# Patient Record
Sex: Female | Born: 1970 | Hispanic: Yes | Marital: Single | Smoking: Never smoker
Health system: Southern US, Community
[De-identification: ages and names within clinical notes are randomized; demographics above are authoritative.]

## PROBLEM LIST (undated history)

## (undated) DIAGNOSIS — R87612 Low grade squamous intraepithelial lesion on cytologic smear of cervix (LGSIL): Secondary | ICD-10-CM

## (undated) HISTORY — DX: Low grade squamous intraepithelial lesion on cytologic smear of cervix (LGSIL): R87.612

---

## 1997-05-20 ENCOUNTER — Inpatient Hospital Stay (HOSPITAL_COMMUNITY): Admission: AD | Admit: 1997-05-20 | Discharge: 1997-05-20 | Payer: Self-pay | Admitting: *Deleted

## 1997-09-16 ENCOUNTER — Ambulatory Visit (HOSPITAL_COMMUNITY): Admission: RE | Admit: 1997-09-16 | Discharge: 1997-09-16 | Payer: Self-pay | Admitting: Obstetrics

## 1997-09-19 ENCOUNTER — Other Ambulatory Visit: Admission: RE | Admit: 1997-09-19 | Discharge: 1997-09-19 | Payer: Self-pay | Admitting: Obstetrics

## 1997-09-29 ENCOUNTER — Encounter (HOSPITAL_COMMUNITY): Admission: RE | Admit: 1997-09-29 | Discharge: 1997-12-09 | Payer: Self-pay | Admitting: Obstetrics

## 1997-10-02 ENCOUNTER — Inpatient Hospital Stay (HOSPITAL_COMMUNITY): Admission: AD | Admit: 1997-10-02 | Discharge: 1997-10-02 | Payer: Self-pay | Admitting: Obstetrics & Gynecology

## 1997-11-21 ENCOUNTER — Inpatient Hospital Stay (HOSPITAL_COMMUNITY): Admission: AD | Admit: 1997-11-21 | Discharge: 1997-11-21 | Payer: Self-pay | Admitting: *Deleted

## 1997-12-02 ENCOUNTER — Inpatient Hospital Stay (HOSPITAL_COMMUNITY): Admission: AD | Admit: 1997-12-02 | Discharge: 1997-12-02 | Payer: Self-pay | Admitting: *Deleted

## 1997-12-04 ENCOUNTER — Inpatient Hospital Stay (HOSPITAL_COMMUNITY): Admission: AD | Admit: 1997-12-04 | Discharge: 1997-12-06 | Payer: Self-pay | Admitting: Obstetrics & Gynecology

## 1998-04-03 ENCOUNTER — Other Ambulatory Visit: Admission: RE | Admit: 1998-04-03 | Discharge: 1998-04-03 | Payer: Self-pay | Admitting: Obstetrics

## 1998-06-26 ENCOUNTER — Encounter: Admission: RE | Admit: 1998-06-26 | Discharge: 1998-06-26 | Payer: Self-pay | Admitting: Obstetrics

## 1998-06-26 ENCOUNTER — Other Ambulatory Visit: Admission: RE | Admit: 1998-06-26 | Discharge: 1998-06-26 | Payer: Self-pay | Admitting: Obstetrics

## 1998-07-10 ENCOUNTER — Encounter: Admission: RE | Admit: 1998-07-10 | Discharge: 1998-07-10 | Payer: Self-pay | Admitting: Obstetrics & Gynecology

## 1998-08-23 ENCOUNTER — Emergency Department (HOSPITAL_COMMUNITY): Admission: EM | Admit: 1998-08-23 | Discharge: 1998-08-23 | Payer: Self-pay

## 1999-01-07 ENCOUNTER — Encounter: Admission: RE | Admit: 1999-01-07 | Discharge: 1999-01-07 | Payer: Self-pay | Admitting: Obstetrics

## 1999-04-20 ENCOUNTER — Other Ambulatory Visit: Admission: RE | Admit: 1999-04-20 | Discharge: 1999-04-20 | Payer: Self-pay | Admitting: Obstetrics & Gynecology

## 1999-04-20 ENCOUNTER — Encounter: Admission: RE | Admit: 1999-04-20 | Discharge: 1999-04-20 | Payer: Self-pay | Admitting: Obstetrics & Gynecology

## 1999-05-28 ENCOUNTER — Encounter: Admission: RE | Admit: 1999-05-28 | Discharge: 1999-05-28 | Payer: Self-pay | Admitting: Obstetrics & Gynecology

## 1999-07-02 ENCOUNTER — Encounter: Admission: RE | Admit: 1999-07-02 | Discharge: 1999-07-02 | Payer: Self-pay | Admitting: Obstetrics & Gynecology

## 1999-07-02 ENCOUNTER — Other Ambulatory Visit: Admission: RE | Admit: 1999-07-02 | Discharge: 1999-07-02 | Payer: Self-pay | Admitting: Obstetrics & Gynecology

## 1999-07-20 ENCOUNTER — Encounter: Admission: RE | Admit: 1999-07-20 | Discharge: 1999-07-20 | Payer: Self-pay | Admitting: Obstetrics & Gynecology

## 1999-11-04 ENCOUNTER — Encounter: Admission: RE | Admit: 1999-11-04 | Discharge: 1999-11-04 | Payer: Self-pay | Admitting: Obstetrics

## 2000-01-11 ENCOUNTER — Encounter: Admission: RE | Admit: 2000-01-11 | Discharge: 2000-01-11 | Payer: Self-pay | Admitting: Obstetrics & Gynecology

## 2000-05-12 ENCOUNTER — Emergency Department (HOSPITAL_COMMUNITY): Admission: EM | Admit: 2000-05-12 | Discharge: 2000-05-13 | Payer: Self-pay | Admitting: Emergency Medicine

## 2000-05-12 ENCOUNTER — Encounter: Admission: RE | Admit: 2000-05-12 | Discharge: 2000-05-12 | Payer: Self-pay | Admitting: Obstetrics & Gynecology

## 2000-06-02 ENCOUNTER — Encounter: Admission: RE | Admit: 2000-06-02 | Discharge: 2000-06-02 | Payer: Self-pay

## 2000-06-02 ENCOUNTER — Emergency Department (HOSPITAL_COMMUNITY): Admission: EM | Admit: 2000-06-02 | Discharge: 2000-06-02 | Payer: Self-pay | Admitting: Emergency Medicine

## 2000-06-02 ENCOUNTER — Encounter: Payer: Self-pay | Admitting: Emergency Medicine

## 2000-06-05 ENCOUNTER — Emergency Department (HOSPITAL_COMMUNITY): Admission: EM | Admit: 2000-06-05 | Discharge: 2000-06-06 | Payer: Self-pay | Admitting: Emergency Medicine

## 2000-06-06 ENCOUNTER — Emergency Department (HOSPITAL_COMMUNITY): Admission: EM | Admit: 2000-06-06 | Discharge: 2000-06-06 | Payer: Self-pay | Admitting: *Deleted

## 2000-06-06 ENCOUNTER — Encounter: Payer: Self-pay | Admitting: Emergency Medicine

## 2000-06-07 ENCOUNTER — Encounter: Admission: RE | Admit: 2000-06-07 | Discharge: 2000-06-07 | Payer: Self-pay | Admitting: Internal Medicine

## 2000-06-14 ENCOUNTER — Encounter: Admission: RE | Admit: 2000-06-14 | Discharge: 2000-06-14 | Payer: Self-pay | Admitting: Hematology and Oncology

## 2000-06-19 ENCOUNTER — Emergency Department (HOSPITAL_COMMUNITY): Admission: EM | Admit: 2000-06-19 | Discharge: 2000-06-19 | Payer: Self-pay | Admitting: Emergency Medicine

## 2000-06-22 ENCOUNTER — Emergency Department (HOSPITAL_COMMUNITY): Admission: EM | Admit: 2000-06-22 | Discharge: 2000-06-22 | Payer: Self-pay | Admitting: Emergency Medicine

## 2000-06-23 ENCOUNTER — Emergency Department (HOSPITAL_COMMUNITY): Admission: EM | Admit: 2000-06-23 | Discharge: 2000-06-23 | Payer: Self-pay | Admitting: Emergency Medicine

## 2000-06-26 ENCOUNTER — Emergency Department (HOSPITAL_COMMUNITY): Admission: EM | Admit: 2000-06-26 | Discharge: 2000-06-26 | Payer: Self-pay | Admitting: *Deleted

## 2000-06-28 ENCOUNTER — Emergency Department (HOSPITAL_COMMUNITY): Admission: EM | Admit: 2000-06-28 | Discharge: 2000-06-28 | Payer: Self-pay | Admitting: Emergency Medicine

## 2000-07-11 ENCOUNTER — Encounter: Admission: RE | Admit: 2000-07-11 | Discharge: 2000-07-11 | Payer: Self-pay | Admitting: Hematology and Oncology

## 2000-07-12 ENCOUNTER — Emergency Department (HOSPITAL_COMMUNITY): Admission: EM | Admit: 2000-07-12 | Discharge: 2000-07-13 | Payer: Self-pay | Admitting: Internal Medicine

## 2000-07-14 ENCOUNTER — Emergency Department (HOSPITAL_COMMUNITY): Admission: EM | Admit: 2000-07-14 | Discharge: 2000-07-15 | Payer: Self-pay | Admitting: Emergency Medicine

## 2000-07-15 ENCOUNTER — Encounter: Payer: Self-pay | Admitting: Emergency Medicine

## 2000-07-25 ENCOUNTER — Encounter: Admission: RE | Admit: 2000-07-25 | Discharge: 2000-07-25 | Payer: Self-pay | Admitting: Hematology and Oncology

## 2000-08-03 ENCOUNTER — Encounter: Admission: RE | Admit: 2000-08-03 | Discharge: 2000-08-03 | Payer: Self-pay | Admitting: Hematology and Oncology

## 2000-08-17 ENCOUNTER — Encounter: Admission: RE | Admit: 2000-08-17 | Discharge: 2000-08-17 | Payer: Self-pay | Admitting: Internal Medicine

## 2000-09-19 ENCOUNTER — Encounter: Admission: RE | Admit: 2000-09-19 | Discharge: 2000-09-19 | Payer: Self-pay | Admitting: Obstetrics & Gynecology

## 2000-09-19 ENCOUNTER — Other Ambulatory Visit: Admission: RE | Admit: 2000-09-19 | Discharge: 2000-09-19 | Payer: Self-pay | Admitting: Obstetrics & Gynecology

## 2001-02-06 ENCOUNTER — Encounter: Admission: RE | Admit: 2001-02-06 | Discharge: 2001-02-06 | Payer: Self-pay | Admitting: Obstetrics & Gynecology

## 2001-04-16 ENCOUNTER — Ambulatory Visit (HOSPITAL_COMMUNITY): Admission: RE | Admit: 2001-04-16 | Discharge: 2001-04-16 | Payer: Self-pay | Admitting: *Deleted

## 2001-05-04 ENCOUNTER — Encounter: Payer: Self-pay | Admitting: *Deleted

## 2001-05-04 ENCOUNTER — Ambulatory Visit (HOSPITAL_COMMUNITY): Admission: RE | Admit: 2001-05-04 | Discharge: 2001-05-04 | Payer: Self-pay | Admitting: Obstetrics & Gynecology

## 2001-06-13 ENCOUNTER — Emergency Department (HOSPITAL_COMMUNITY): Admission: EM | Admit: 2001-06-13 | Discharge: 2001-06-13 | Payer: Self-pay | Admitting: Emergency Medicine

## 2001-10-12 ENCOUNTER — Inpatient Hospital Stay (HOSPITAL_COMMUNITY): Admission: AD | Admit: 2001-10-12 | Discharge: 2001-10-14 | Payer: Self-pay | Admitting: *Deleted

## 2001-10-12 ENCOUNTER — Encounter (INDEPENDENT_AMBULATORY_CARE_PROVIDER_SITE_OTHER): Payer: Self-pay

## 2001-10-16 HISTORY — PX: TUBAL LIGATION: SHX77

## 2002-02-13 ENCOUNTER — Encounter: Admission: RE | Admit: 2002-02-13 | Discharge: 2002-02-13 | Payer: Self-pay | Admitting: *Deleted

## 2003-04-25 ENCOUNTER — Encounter: Admission: RE | Admit: 2003-04-25 | Discharge: 2003-04-25 | Payer: Self-pay | Admitting: Family Medicine

## 2003-06-02 ENCOUNTER — Encounter: Admission: RE | Admit: 2003-06-02 | Discharge: 2003-06-02 | Payer: Self-pay | Admitting: Family Medicine

## 2003-11-13 ENCOUNTER — Encounter: Admission: RE | Admit: 2003-11-13 | Discharge: 2003-11-13 | Payer: Self-pay | Admitting: Family Medicine

## 2004-07-16 ENCOUNTER — Ambulatory Visit: Payer: Self-pay | Admitting: Family Medicine

## 2004-07-27 ENCOUNTER — Ambulatory Visit: Payer: Self-pay | Admitting: Family Medicine

## 2005-07-14 ENCOUNTER — Emergency Department (HOSPITAL_COMMUNITY): Admission: EM | Admit: 2005-07-14 | Discharge: 2005-07-15 | Payer: Self-pay | Admitting: Emergency Medicine

## 2005-07-17 ENCOUNTER — Encounter (INDEPENDENT_AMBULATORY_CARE_PROVIDER_SITE_OTHER): Payer: Self-pay | Admitting: *Deleted

## 2005-07-17 LAB — CONVERTED CEMR LAB

## 2005-08-08 ENCOUNTER — Encounter (INDEPENDENT_AMBULATORY_CARE_PROVIDER_SITE_OTHER): Payer: Self-pay | Admitting: Specialist

## 2005-08-08 ENCOUNTER — Ambulatory Visit: Payer: Self-pay | Admitting: Family Medicine

## 2005-09-20 ENCOUNTER — Ambulatory Visit: Payer: Self-pay | Admitting: Family Medicine

## 2005-09-28 ENCOUNTER — Ambulatory Visit: Payer: Self-pay | Admitting: Family Medicine

## 2005-10-28 ENCOUNTER — Emergency Department (HOSPITAL_COMMUNITY): Admission: EM | Admit: 2005-10-28 | Discharge: 2005-10-28 | Payer: Self-pay | Admitting: Family Medicine

## 2006-01-03 ENCOUNTER — Emergency Department (HOSPITAL_COMMUNITY): Admission: EM | Admit: 2006-01-03 | Discharge: 2006-01-03 | Payer: Self-pay | Admitting: Family Medicine

## 2006-01-06 ENCOUNTER — Ambulatory Visit: Payer: Self-pay | Admitting: Gynecology

## 2006-01-29 ENCOUNTER — Emergency Department (HOSPITAL_COMMUNITY): Admission: EM | Admit: 2006-01-29 | Discharge: 2006-01-29 | Payer: Self-pay | Admitting: Emergency Medicine

## 2006-06-16 ENCOUNTER — Encounter (INDEPENDENT_AMBULATORY_CARE_PROVIDER_SITE_OTHER): Payer: Self-pay | Admitting: *Deleted

## 2006-07-17 ENCOUNTER — Emergency Department (HOSPITAL_COMMUNITY): Admission: EM | Admit: 2006-07-17 | Discharge: 2006-07-17 | Payer: Self-pay | Admitting: Emergency Medicine

## 2006-08-09 ENCOUNTER — Emergency Department (HOSPITAL_COMMUNITY): Admission: EM | Admit: 2006-08-09 | Discharge: 2006-08-09 | Payer: Self-pay | Admitting: Family Medicine

## 2006-11-08 ENCOUNTER — Encounter: Payer: Self-pay | Admitting: *Deleted

## 2007-01-19 ENCOUNTER — Ambulatory Visit: Payer: Self-pay | Admitting: Family Medicine

## 2007-02-20 ENCOUNTER — Encounter (INDEPENDENT_AMBULATORY_CARE_PROVIDER_SITE_OTHER): Payer: Self-pay | Admitting: Family Medicine

## 2007-02-20 ENCOUNTER — Ambulatory Visit: Payer: Self-pay | Admitting: Family Medicine

## 2007-02-20 DIAGNOSIS — E669 Obesity, unspecified: Secondary | ICD-10-CM

## 2007-02-20 LAB — CONVERTED CEMR LAB: Beta hcg, urine, semiquantitative: NEGATIVE

## 2007-02-21 LAB — CONVERTED CEMR LAB
ALT: 17 units/L (ref 0–35)
BUN: 10 mg/dL (ref 6–23)
Chloride: 102 meq/L (ref 96–112)
Glucose, Bld: 87 mg/dL (ref 70–99)
Sodium: 141 meq/L (ref 135–145)
Total CHOL/HDL Ratio: 4.3
Total Protein: 8 g/dL (ref 6.0–8.3)

## 2007-02-26 ENCOUNTER — Telehealth (INDEPENDENT_AMBULATORY_CARE_PROVIDER_SITE_OTHER): Payer: Self-pay | Admitting: Family Medicine

## 2007-02-27 ENCOUNTER — Telehealth: Payer: Self-pay | Admitting: *Deleted

## 2007-03-06 ENCOUNTER — Ambulatory Visit: Payer: Self-pay | Admitting: Family Medicine

## 2007-03-06 DIAGNOSIS — R87613 High grade squamous intraepithelial lesion on cytologic smear of cervix (HGSIL): Secondary | ICD-10-CM

## 2007-03-19 ENCOUNTER — Encounter: Payer: Self-pay | Admitting: Family Medicine

## 2007-03-19 ENCOUNTER — Telehealth (INDEPENDENT_AMBULATORY_CARE_PROVIDER_SITE_OTHER): Payer: Self-pay | Admitting: *Deleted

## 2007-03-20 ENCOUNTER — Encounter (INDEPENDENT_AMBULATORY_CARE_PROVIDER_SITE_OTHER): Payer: Self-pay | Admitting: *Deleted

## 2007-03-21 ENCOUNTER — Encounter (INDEPENDENT_AMBULATORY_CARE_PROVIDER_SITE_OTHER): Payer: Self-pay | Admitting: *Deleted

## 2007-04-07 ENCOUNTER — Emergency Department (HOSPITAL_COMMUNITY): Admission: EM | Admit: 2007-04-07 | Discharge: 2007-04-08 | Payer: Self-pay | Admitting: Emergency Medicine

## 2007-06-28 ENCOUNTER — Encounter: Payer: Self-pay | Admitting: *Deleted

## 2007-06-28 ENCOUNTER — Ambulatory Visit: Payer: Self-pay | Admitting: Sports Medicine

## 2007-06-28 ENCOUNTER — Encounter (INDEPENDENT_AMBULATORY_CARE_PROVIDER_SITE_OTHER): Payer: Self-pay | Admitting: Family Medicine

## 2007-06-28 LAB — CONVERTED CEMR LAB
Bilirubin Urine: NEGATIVE
Glucose, Urine, Semiquant: NEGATIVE
Nitrite: POSITIVE
Specific Gravity, Urine: 1.015
Urobilinogen, UA: 1

## 2007-07-13 ENCOUNTER — Ambulatory Visit: Payer: Self-pay | Admitting: Family Medicine

## 2007-07-13 ENCOUNTER — Encounter: Payer: Self-pay | Admitting: Family Medicine

## 2007-07-13 ENCOUNTER — Encounter: Payer: Self-pay | Admitting: *Deleted

## 2007-12-06 ENCOUNTER — Ambulatory Visit: Payer: Self-pay | Admitting: Family Medicine

## 2008-02-07 ENCOUNTER — Encounter: Payer: Self-pay | Admitting: Family Medicine

## 2008-02-07 ENCOUNTER — Ambulatory Visit: Payer: Self-pay | Admitting: Family Medicine

## 2008-02-17 DIAGNOSIS — R87612 Low grade squamous intraepithelial lesion on cytologic smear of cervix (LGSIL): Secondary | ICD-10-CM

## 2008-02-17 HISTORY — DX: Low grade squamous intraepithelial lesion on cytologic smear of cervix (LGSIL): R87.612

## 2008-02-25 ENCOUNTER — Encounter: Payer: Self-pay | Admitting: Family Medicine

## 2008-03-18 ENCOUNTER — Encounter (INDEPENDENT_AMBULATORY_CARE_PROVIDER_SITE_OTHER): Payer: Self-pay | Admitting: *Deleted

## 2008-05-19 ENCOUNTER — Ambulatory Visit: Payer: Self-pay | Admitting: Family Medicine

## 2008-05-19 LAB — CONVERTED CEMR LAB: Rapid Strep: NEGATIVE

## 2008-09-16 ENCOUNTER — Ambulatory Visit: Payer: Self-pay | Admitting: Family Medicine

## 2008-09-16 LAB — CONVERTED CEMR LAB: Beta hcg, urine, semiquantitative: NEGATIVE

## 2008-10-24 ENCOUNTER — Emergency Department (HOSPITAL_COMMUNITY): Admission: EM | Admit: 2008-10-24 | Discharge: 2008-10-24 | Payer: Self-pay | Admitting: Family Medicine

## 2008-12-15 ENCOUNTER — Ambulatory Visit: Payer: Self-pay | Admitting: Family Medicine

## 2008-12-15 ENCOUNTER — Encounter: Payer: Self-pay | Admitting: Family Medicine

## 2009-01-27 ENCOUNTER — Encounter: Payer: Self-pay | Admitting: Family Medicine

## 2009-01-28 ENCOUNTER — Ambulatory Visit: Payer: Self-pay | Admitting: Family Medicine

## 2009-01-28 ENCOUNTER — Encounter: Payer: Self-pay | Admitting: Family Medicine

## 2009-05-27 ENCOUNTER — Emergency Department (HOSPITAL_COMMUNITY): Admission: EM | Admit: 2009-05-27 | Discharge: 2009-05-27 | Payer: Self-pay | Admitting: Family Medicine

## 2009-05-27 ENCOUNTER — Encounter: Payer: Self-pay | Admitting: Family Medicine

## 2009-07-30 ENCOUNTER — Ambulatory Visit: Payer: Self-pay | Admitting: Internal Medicine

## 2009-08-11 ENCOUNTER — Ambulatory Visit: Payer: Self-pay | Admitting: Family Medicine

## 2009-08-12 ENCOUNTER — Ambulatory Visit: Payer: Self-pay | Admitting: Family Medicine

## 2009-08-12 ENCOUNTER — Encounter: Payer: Self-pay | Admitting: Family Medicine

## 2009-08-12 LAB — CONVERTED CEMR LAB
ALT: 13 units/L (ref 0–35)
AST: 11 units/L (ref 0–37)
BUN: 12 mg/dL (ref 6–23)
CO2: 27 meq/L (ref 19–32)
Chloride: 104 meq/L (ref 96–112)
HDL: 35 mg/dL — ABNORMAL LOW (ref >39)
Potassium: 4.3 meq/L (ref 3.5–5.3)
Sodium: 139 meq/L (ref 135–145)
Total CHOL/HDL Ratio: 3.8
Triglycerides: 114 mg/dL (ref <150)
VLDL: 23 mg/dL (ref 0–40)

## 2009-08-13 ENCOUNTER — Encounter: Payer: Self-pay | Admitting: Family Medicine

## 2009-08-17 ENCOUNTER — Encounter: Payer: Self-pay | Admitting: Family Medicine

## 2009-09-10 ENCOUNTER — Ambulatory Visit: Payer: Self-pay | Admitting: Family Medicine

## 2009-09-17 ENCOUNTER — Encounter: Payer: Self-pay | Admitting: Family Medicine

## 2009-09-25 ENCOUNTER — Emergency Department (HOSPITAL_COMMUNITY): Admission: EM | Admit: 2009-09-25 | Discharge: 2009-09-25 | Payer: Self-pay | Admitting: Emergency Medicine

## 2009-10-01 ENCOUNTER — Other Ambulatory Visit: Admission: RE | Admit: 2009-10-01 | Discharge: 2009-10-01 | Payer: Self-pay | Admitting: Gynecology

## 2009-10-01 ENCOUNTER — Ambulatory Visit: Payer: Self-pay | Admitting: Obstetrics & Gynecology

## 2009-10-22 ENCOUNTER — Ambulatory Visit: Payer: Self-pay | Admitting: Obstetrics and Gynecology

## 2009-10-29 ENCOUNTER — Ambulatory Visit: Payer: Self-pay | Admitting: Obstetrics and Gynecology

## 2009-10-29 ENCOUNTER — Other Ambulatory Visit: Admission: RE | Admit: 2009-10-29 | Discharge: 2009-10-29 | Payer: Self-pay | Admitting: Obstetrics & Gynecology

## 2009-11-12 ENCOUNTER — Ambulatory Visit: Payer: Self-pay | Admitting: Obstetrics & Gynecology

## 2010-04-12 ENCOUNTER — Emergency Department (HOSPITAL_COMMUNITY)
Admission: EM | Admit: 2010-04-12 | Discharge: 2010-04-12 | Payer: Self-pay | Source: Home / Self Care | Admitting: Emergency Medicine

## 2010-04-18 HISTORY — PX: LEEP: SHX91

## 2010-05-17 ENCOUNTER — Ambulatory Visit
Admission: RE | Admit: 2010-05-17 | Discharge: 2010-05-17 | Payer: Self-pay | Source: Home / Self Care | Attending: Obstetrics and Gynecology | Admitting: Obstetrics and Gynecology

## 2010-05-17 ENCOUNTER — Other Ambulatory Visit (HOSPITAL_COMMUNITY)
Admission: RE | Admit: 2010-05-17 | Discharge: 2010-05-17 | Disposition: A | Discharge status: Home / Self Care | Payer: Self-pay | Source: Ambulatory Visit | Attending: Obstetrics & Gynecology | Admitting: Obstetrics & Gynecology

## 2010-05-17 ENCOUNTER — Other Ambulatory Visit: Payer: Self-pay | Admitting: Obstetrics & Gynecology

## 2010-05-17 DIAGNOSIS — Z01419 Encounter for gynecological examination (general) (routine) without abnormal findings: Secondary | ICD-10-CM | POA: Insufficient documentation

## 2010-05-17 DIAGNOSIS — Z1159 Encounter for screening for other viral diseases: Secondary | ICD-10-CM | POA: Insufficient documentation

## 2010-05-18 NOTE — Letter (Signed)
Summary: Generic Letter  [facility]  [address]   [place], [place] [address]   Phone: [phone]  Fax: [phone]    08/17/2009  [facility] [address] [place], [place]  [address]  Dear Catherine Austin,  Su papaniculau vuelve abnormal.  Necesita hacer una cita para colposcopia, como discutimos, para obtener una biopsia.  Por favor, llame a la clinica para hacer una cita en [facility] para una colposcopia.  Si tiene preguntas, llamame.          Sincerely,   Ardeen Garland  MD  Appended Document: Generic Letter mailed.  Appended Document: Generic Letter will be here for pap next thursday

## 2010-05-18 NOTE — Assessment & Plan Note (Signed)
Summary: colpo/[place]/mayans   Vital Signs:  Patient profile:   40 year old female Height:      62 inches Weight:      219.1 pounds BMI:     40.22 Temp:     98.0 degrees F oral Pulse rate:   77 / minute BP sitting:   109 / 74  (left arm) Cuff size:   large  Vitals Entered By: Gladstone Pih (Sep 10, 2009 11:23 AM) CC: Colpo Is Patient Diabetic? No Pain Assessment Patient in pain? no        Primary Care Provider:  Ardeen Garland  MD  CC:  Colpo.  History of Present Illness: Here for colposcopy asymptomatic but multiple prior abnl paps, abnormal colpo. Was referred at last colposcopy visit but she never went to GYN  Habits & Providers  Alcohol-Tobacco-Diet     Tobacco Status: never  Allergies: No Known Drug Allergies  Physical Exam  General:  alert and well-developed.     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 She did not go for her referral after last colposcopy. Using interpretor Darrelyn Hillock) today I think I impressed upon her the absolute need to comply with refewrral at this point. She agreed.  Complete Medication List: 1)  Claritin 10 Mg Tabs (Loratadine) .Marland Kitchen.. 1 by mouth once daily for itching rash/allergies.  instructions in spanish please 2)  Ibuprofen 600 Mg Tabs (Ibuprofen) .Marland Kitchen.. 1 tab by mouth three times a day for 1 week then q 6 hours as needed for pain. instructions in spanish please

## 2010-05-18 NOTE — Letter (Signed)
Summary: Generic Letter  [facility]  [address]   [place], [place] [address]   Phone: [phone]  Fax: [phone]    08/13/2009  [facility] [address] [place], [place]  [address]  Dear Ms. Samad,  Es un placer decirle que todos de sus examenes de sangre son normales.  Los rinones son buenos.  No tiene diabetes o colesterol alto.  Si tiene preguntas, por favor llama a Event organiser.          Sincerely,   Ardeen Garland  MD  Appended Document: Generic Letter mailed.

## 2010-05-18 NOTE — Miscellaneous (Signed)
Summary: late for wi appt  Clinical Lists Changes pt came more than 30 minutes late for appt - it was 11:25am. (thinks she has the flu) the md has left for lunch. we have no other appts left for the day. suggested UC as they could see her now w/o appt. she agreed with plan.[place] Circle RN  May 27, 2009 12:04 PM

## 2010-05-18 NOTE — Assessment & Plan Note (Signed)
Summary: CPP/[place]   Vital Signs:  Patient profile:   40 year old female Height:      62 inches Weight:      216 pounds BMI:     39.65 Temp:     98.3 degrees F oral Pulse rate:   87 / minute BP sitting:   108 / 71  (left arm) Cuff size:   large  Vitals Entered By: Tessie Fass CMA (August 11, 2009 11:16 AM) CC: complete physical with pap Is Patient Diabetic? No Pain Assessment Patient in pain? no        Primary Care Provider:  Ardeen Garland  MD  CC:  complete physical with pap.  History of Present Illness: Catherine Austin comes in for a physical with pap.  Her last pap was in 10/09 and was LSIL.  She did not keep her colpo appt.  This is the first pap since then. SHe is feeling well and has no complaints.  SHe has already eaten today.   Habits & Providers  Alcohol-Tobacco-Diet     Tobacco Status: never  Current Medications (verified): 1)  Claritin 10 Mg Tabs (Loratadine) .Marland Kitchen.. 1 By Mouth Once Daily For Itching Rash/allergies.  Instructions in Spanish Please 2)  Ibuprofen 600 Mg Tabs (Ibuprofen) .Marland Kitchen.. 1 Tab By Mouth Three Times A Day For 1 Week Then Q 6 Hours As Needed For Pain. 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  Colpo showed LGSiL.  [facility] but didn't keep appt.  Past Surgical History: Last updated: 06/28/2007 btl - 09/16/2001, olonoscopy  low grade IEL - 09/16/2005    Family History: Last updated: 02/07/2008 anemia, Colon CA 1st degree .  NO family history of breast cancer.   Social History: Last updated: 12/15/2008 from [place] here since 1994; lives with husband, 3 kids; denies etoh, tobacco, drugs .  broken english  Physical Exam  General:  obese, alert, NAD, cooperative to exam vitals reviewed Eyes:  pupils equal, pupils round, pupils reactive to light, corneas and lenses clear, and no  injection.   Ears:  R ear normal and L ear normal.   Nose:  no nasal discharge, no mucosal pallor, and no mucosal edema.   Mouth:  Oral mucosa and oropharynx without lesions or exudates.  Teeth in good repair. Neck:  No deformities, masses, or tenderness noted. Breasts:  No mass, nodules, thickening, tenderness, bulging, retraction, inflamation, nipple discharge or skin changes noted.   Lungs:  Normal respiratory effort, chest expands symmetrically. Lungs are clear to auscultation, no crackles or wheezes. Heart:  Normal rate and regular rhythm. S1 and S2 normal without gallop, murmur, click, rub or other extra sounds. Abdomen:  Bowel sounds positive,abdomen soft and non-tender without masses, organomegaly or hernias noted. Genitalia:  normal introitus, no external lesions, no vaginal discharge, mucosa pink and moist, no vaginal atrophy, no friaility or hemorrhage, normal uterus size and position, and no adnexal masses or tenderness.  cervix erythematous and friable Msk:  normal ROM.   Pulses:  2+ radial and dp pulses Extremities:  no edema Neurologic:  alert & oriented X3 and gait normal.   Skin:  Intact without suspicious lesions or rashes Cervical Nodes:  No lymphadenopathy noted Axillary Nodes:  No palpable lymphadenopathy Psych:  Oriented X3, memory intact for recent and remote, normally interactive, good eye contact, not anxious appearing,  and not depressed appearing.     Impression & Recommendations:  Problem # 1:  ROUTINE GYNECOLOGICAL EXAMINATION (ICD-V72.31) Assessment New  Pap obtained.  DIscussed what colpo was and that if pap abnormal again, she would have to get it done.  Discussed imprtance of the colpo for identifying early cancer changes.  Patient expressed understanding.   Orders: [facility] - Est  18-39 yrs ([id number])  Complete Medication List: 1)  Claritin 10 Mg Tabs (Loratadine) .Marland Kitchen.. 1 by mouth once daily for itching rash/allergies.  instructions in spanish please 2)   Ibuprofen 600 Mg Tabs (Ibuprofen) .Marland Kitchen.. 1 tab by mouth three times a day for 1 week then q 6 hours as needed for pain. instructions in spanish please  Other Orders: Pap Smear-[facility] ([id number]) Lipid-[facility] [phone]) Comp Met-[facility] [phone]) TSH-[facility] [phone]) Future Orders: Lipid-[facility] ([id number]) ... 09/08/2009 Comp Met-[facility] ([id number]) ... 09/08/2009 TSH-[facility] [id number]) ... 09/08/2009

## 2010-05-18 NOTE — Miscellaneous (Signed)
Summary: Procedure Consent  Procedure Consent   Imported By: Bradly Bienenstock 09/17/2009 11:54:42  _____________________________________________________________________  External Attachment:    Type:   Image     Comment:   External Document

## 2010-05-25 ENCOUNTER — Encounter: Payer: Self-pay | Admitting: *Deleted

## 2010-06-10 ENCOUNTER — Other Ambulatory Visit: Payer: Self-pay | Admitting: Obstetrics and Gynecology

## 2010-06-10 ENCOUNTER — Encounter: Payer: PRIVATE HEALTH INSURANCE | Admitting: Obstetrics and Gynecology

## 2010-06-10 ENCOUNTER — Other Ambulatory Visit (HOSPITAL_COMMUNITY)
Admission: RE | Admit: 2010-06-10 | Discharge: 2010-06-10 | Disposition: A | Discharge status: Home / Self Care | Payer: Self-pay | Source: Ambulatory Visit | Attending: Obstetrics & Gynecology | Admitting: Obstetrics & Gynecology

## 2010-06-10 DIAGNOSIS — R87610 Atypical squamous cells of undetermined significance on cytologic smear of cervix (ASC-US): Secondary | ICD-10-CM

## 2010-06-10 LAB — POCT PREGNANCY, URINE: Preg Test, Ur: NEGATIVE

## 2010-06-19 ENCOUNTER — Encounter: Payer: Self-pay | Admitting: Family Medicine

## 2010-06-21 ENCOUNTER — Ambulatory Visit: Payer: PRIVATE HEALTH INSURANCE | Admitting: Family Medicine

## 2010-06-24 ENCOUNTER — Ambulatory Visit: Payer: PRIVATE HEALTH INSURANCE | Admitting: Obstetrics and Gynecology

## 2010-06-24 DIAGNOSIS — N871 Moderate cervical dysplasia: Secondary | ICD-10-CM

## 2010-06-28 LAB — POCT RAPID STREP A (OFFICE): Streptococcus, Group A Screen (Direct): POSITIVE — AB

## 2010-06-28 LAB — POCT PREGNANCY, URINE: Preg Test, Ur: NEGATIVE

## 2010-07-04 LAB — GLUCOSE, CAPILLARY: Glucose-Capillary: 124 mg/dL — ABNORMAL HIGH (ref 70–99)

## 2010-07-04 LAB — POCT PREGNANCY, URINE: Preg Test, Ur: NEGATIVE

## 2010-07-08 ENCOUNTER — Ambulatory Visit (INDEPENDENT_AMBULATORY_CARE_PROVIDER_SITE_OTHER): Payer: Self-pay | Admitting: Family Medicine

## 2010-07-08 ENCOUNTER — Encounter: Payer: Self-pay | Admitting: Family Medicine

## 2010-07-08 DIAGNOSIS — J069 Acute upper respiratory infection, unspecified: Secondary | ICD-10-CM

## 2010-07-08 DIAGNOSIS — R87613 High grade squamous intraepithelial lesion on cytologic smear of cervix (HGSIL): Secondary | ICD-10-CM

## 2010-07-08 NOTE — Patient Instructions (Signed)
Para la tos: miel mucinex  Para el mucus del nariz: Spray de salina  siga tomando muchos liquidos

## 2010-07-08 NOTE — Progress Notes (Signed)
  Subjective:    Patient ID: Catherine Austin, female    DOB: 01/18/1971, 40 y.o.   MRN: [id number]  HPI Cold symptoms x 4 days: H/a,subjective fever, chills, decreased appetite, able to eat yogurt, and fruit.  Drinking lots of fluids.  Having runny nose.  + cough. Sore throat.  + dizziness. Tired but still able to do housework.   Review of Systems No nausea. No vomiting.  No diarrhea.  No blurry vision.      Objective:   Physical Exam  Constitutional: She is oriented to person, place, and time. She appears well-developed. No distress.  HENT:  Head: Normocephalic and atraumatic.  Right Ear: External ear normal.  Left Ear: External ear normal.  Nose: Nose normal.  Eyes: Pupils are equal, round, and reactive to light. Right eye exhibits no discharge. Left eye exhibits no discharge.  Neck: Normal range of motion.  Cardiovascular: Normal rate, regular rhythm and normal heart sounds.   Pulmonary/Chest: Effort normal and breath sounds normal. No respiratory distress. She has no wheezes.  Abdominal: Soft. She exhibits no distension.  Musculoskeletal: She exhibits no edema.  Lymphadenopathy:    She has no cervical adenopathy.  Neurological: She is alert and oriented to person, place, and time.  Skin: Skin is warm and dry.  Psychiatric: She has a normal mood and affect. Her behavior is normal.          Assessment & Plan:

## 2010-07-09 ENCOUNTER — Encounter: Payer: Self-pay | Admitting: Family Medicine

## 2010-07-09 DIAGNOSIS — J069 Acute upper respiratory infection, unspecified: Secondary | ICD-10-CM | POA: Insufficient documentation

## 2010-07-09 NOTE — Assessment & Plan Note (Signed)
Most likely symptoms are caused by viral etilogy. No abnormalities on physical exam. No fever.  Pt given red flags for return. Gave pt information on symptom management.

## 2010-07-09 NOTE — Progress Notes (Unsigned)
NAMEADABELLE, Catherine Austin                 ACCOUNT NO.:  [id number]  MEDICAL RECORD NO.:  [id number]           PATIENT TYPE:  A  LOCATION:  [facility]                   FACILITY:  [facility]  PHYSICIAN:  Argentina Donovan, MD        DATE OF BIRTH:  May 14, 1970  DATE OF SERVICE:  06/24/2010                                 CLINIC NOTE  HISTORY OF PRESENT ILLNESS:  The patient is a 40 year old gravida 5, para 5-0-0-5 who had a tubal ligation and had an abnormal Pap smear. She came in on June 10, 2010.  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  She had previously had Trichomonas which had been treated.  We had her watch the movie on the LEEP today.  All questions were answered and we are going to have her scheduled to come back for a LEEP procedure to be done.  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  PLAN:  LEEP, loop electrocautery excision procedure.          ______________________________ Argentina Donovan, MD    PR/MEDQ  D:  06/24/2010  T:  06/25/2010  Job:  [id number]

## 2010-07-21 ENCOUNTER — Other Ambulatory Visit: Payer: Self-pay | Admitting: Obstetrics and Gynecology

## 2010-07-21 ENCOUNTER — Other Ambulatory Visit (HOSPITAL_COMMUNITY)
Admission: RE | Admit: 2010-07-21 | Discharge: 2010-07-21 | Disposition: A | Discharge status: Home / Self Care | Payer: Self-pay | Source: Ambulatory Visit | Attending: Obstetrics and Gynecology | Admitting: Obstetrics and Gynecology

## 2010-07-21 ENCOUNTER — Encounter: Payer: Self-pay | Admitting: Obstetrics and Gynecology

## 2010-07-21 DIAGNOSIS — N871 Moderate cervical dysplasia: Secondary | ICD-10-CM | POA: Insufficient documentation

## 2010-07-21 DIAGNOSIS — R87613 High grade squamous intraepithelial lesion on cytologic smear of cervix (HGSIL): Secondary | ICD-10-CM

## 2010-07-21 DIAGNOSIS — N870 Mild cervical dysplasia: Secondary | ICD-10-CM | POA: Insufficient documentation

## 2010-07-21 LAB — POCT PREGNANCY, URINE: Preg Test, Ur: NEGATIVE

## 2010-07-22 ENCOUNTER — Other Ambulatory Visit: Payer: Self-pay | Admitting: Obstetrics & Gynecology

## 2010-07-22 DIAGNOSIS — Z1231 Encounter for screening mammogram for malignant neoplasm of breast: Secondary | ICD-10-CM

## 2010-07-22 NOTE — Progress Notes (Signed)
Catherine Austin, Catherine Austin                 ACCOUNT NO.:  [id number]  MEDICAL RECORD NO.:  [id number]           PATIENT TYPE:  A  LOCATION:  [facility]                   FACILITY:  [facility]  PHYSICIAN:  Catalina Antigua, MD     DATE OF BIRTH:  [birth year]  DATE OF SERVICE:  07/21/2010                                 CLINIC NOTE  This is a 40 year old gravida 5, para 5 with a longstanding history of abnormal Pap smears since 2007, status post LEEP procedure in July 2011 who presents today for another a LEEP procedure after an abnormal Pap smear in January 2012 and colposcopy demonstrated high-grade squamous intraepithelial lesion CIN II on June 10, 2010.  The procedure was performed without any complication after informed consent was obtained and injection of lidocaine solution under the circumference of the cervix.  Rollerball cautery of the cervical bed was performed to ensure persistence of hemostasis.  Monsel solution was applied to the cervical bed.  The patient tolerated the procedure well and will return in 2 weeks to discuss the results and further management.          ______________________________ Catalina Antigua, MD    PC/MEDQ  D:  07/21/2010  T:  07/22/2010  Job:  [id number]

## 2010-07-26 LAB — POCT RAPID STREP A (OFFICE): Streptococcus, Group A Screen (Direct): POSITIVE — AB

## 2010-08-04 ENCOUNTER — Ambulatory Visit (HOSPITAL_COMMUNITY): Payer: Self-pay

## 2010-08-13 ENCOUNTER — Ambulatory Visit: Payer: Self-pay | Admitting: Obstetrics and Gynecology

## 2010-08-13 DIAGNOSIS — R87613 High grade squamous intraepithelial lesion on cytologic smear of cervix (HGSIL): Secondary | ICD-10-CM

## 2010-09-03 NOTE — Op Note (Signed)
   [name], [name]                           ACCOUNT NO.:  [id number]   MEDICAL RECORD NO.:  [id number]                   PATIENT TYPE:  NP   LOCATION:  9138                                 [facility]   PHYSICIAN:  Conni Elliot, M.D.             DATE OF BIRTH:  [birth year]   DATE OF PROCEDURE:  10/13/2001  DATE OF DISCHARGE:  10/14/2001                                 OPERATIVE REPORT   PREOPERATIVE DIAGNOSES:  Desire for surgical sterilization.   POSTOPERATIVE DIAGNOSES:  Desire for surgical sterilization.   OPERATION PERFORMED:  Modified bilateral P[place]oy tubal ligation.   SURGEON:  Conni Elliot, M.D.   ANESTHESIA:  Spinal.   DESCRIPTION OF PROCEDURE:  After placing the patient under spinal  anesthetic, a periumbilical incision was made through the skin and fascia.  The peritoneum was entered.  The left fallopian tube was identified and  grasped with Babcock clamps and followed to its fimbriated end.  The tube  was brought to the operative field, doubly suture ligated proximal end, and  knuckle of tube was excised.  Hemostasis was adequate.  Similar procedure  was done on the opposite site.  Anterior peritoneum, fascia, and skin  subsequently closed in routine fashion.   ESTIMATED BLOOD LOSS:  Less than 10 cc.                                                Conni Elliot, M.D.    ASG/MEDQ  D:  11/13/2001  T:  11/15/2001  Job:  [id number]

## 2010-10-01 ENCOUNTER — Ambulatory Visit: Payer: Self-pay | Admitting: Family Medicine

## 2010-12-09 ENCOUNTER — Ambulatory Visit: Payer: Self-pay

## 2011-01-21 LAB — I-STAT 8, (EC8 V) (CONVERTED LAB)
Bicarbonate: 25.2 — ABNORMAL HIGH
HCT: 42
TCO2: 26
pCO2, Ven: 41.2 — ABNORMAL LOW
pH, Ven: 7.394 — ABNORMAL HIGH

## 2011-01-21 LAB — CBC
HCT: 38.9
MCHC: 33.9
MCV: 83.2
RBC: 4.68
WBC: 8.3

## 2011-01-21 LAB — POCT I-STAT CREATININE: Creatinine, Ser: 0.7

## 2011-01-21 LAB — POCT PREGNANCY, URINE: Operator id: 133351

## 2011-01-21 LAB — DIFFERENTIAL
Basophils Relative: 0
Eosinophils Absolute: 0 — ABNORMAL LOW
Eosinophils Relative: 1
Lymphs Abs: 2.2
Monocytes Relative: 5
Neutrophils Relative %: 67

## 2011-02-18 ENCOUNTER — Encounter: Payer: Self-pay | Admitting: Family Medicine

## 2011-02-18 ENCOUNTER — Ambulatory Visit (INDEPENDENT_AMBULATORY_CARE_PROVIDER_SITE_OTHER): Payer: Self-pay | Admitting: Family Medicine

## 2011-02-18 VITALS — BP 128/87 | HR 96 | Temp 99.1°F | Wt 207.7 lb

## 2011-02-18 DIAGNOSIS — J029 Acute pharyngitis, unspecified: Secondary | ICD-10-CM | POA: Insufficient documentation

## 2011-02-18 NOTE — Patient Instructions (Signed)
Faringitis Viral (Viral Pharyngitis)  La faringitis virales una infeccin viral que produce enrojecimiento, dolor e hinchazn (inflamacin) en la garganta. No se disemina de [place] persona a otra (no es contagiosa).  CAUSAS La causa es la inhalacin de una gran cantidad de grmenes llamados virus. Muchos virus diferentes pueden causar faringitis viral. SNTOMAS Los sntomas de faringitis viral son:  Dolor de Primary school teacher.    Nariz tapada.     Fiebre no muy elevada     Congestin    Tos  TRATAMIENTO El tratamiento incluye reposo, beber muchos lquidos y el uso de medicamentos de venta libre (autorizados por el mdico) INSTRUCCIONES PARA EL CUIDADO EN EL HOGAR    Debe ingerir gran cantidad de lquido para mantener la orina de tono claro o color amarillo plido.     Consuma alimentos blandos, fros, como helados de crema, de agua o gelatina.     Puede hacer grgaras con agua tibia con sal (una cucharadita en 1 litro de agua).     Despus de los 7 aos, pueden administrarse pastillas para la tos con seguridad.     Solo tome medicamentos que se pueden comprar sin receta o recetados para Chief Technology Officer, Dentist o fiebre, como le indica el mdico. No tome aspirina  Para no contagiar evite:  El contacto boca a boca con Economist.     Compartir utensilios para comer o beber.     Toser cerca de otras personas  SOLICITE ATENCIN MDICA SI:    Mejora luego de [employer] luego [place].     Tiene fiebre o siente un dolor intenso que no puede ser controlado con los medicamentos.     Hay otros cambios que lo preocupan.  Document Released: 01/12/2005 Document Revised: 12/15/2010 [facility] Patient Information 2012 [place], [place].Faringitis Viral

## 2011-02-18 NOTE — Progress Notes (Signed)
  Subjective:    Patient ID: Catherine Austin, female    DOB: 1970-11-26, 40 y.o.   MRN: [id number]  HPI 40 year old here for a same-day appointment for 3 days of sore throat  She reports 3 days of sore throat keeping her up at night. Multiple members in her family also have similar illness. She notes some cough, no dyspnea, no sputum, no fever. She reports no abdominal pain, diarrhea or emesis. She does have some nausea  She has not been taking any medication for pain or doing anything to relieve her sore throat  She has not exposed to any Review of Systems Please see history of present illness    Objective:   Physical Exam  GEN: Alert & Oriented, No acute distress CV:  Regular Rate & Rhythm, no murmur Respiratory:  Normal work of breathing, CTAB HEENT: /AT. EOMI, PERRLA, no conjunctival injection or scleral icterus.  Bilateral tympanic membranes intact without erythema or effusion.  .  Nares without edema or rhinorrhea.  Oropharynx is with erythema but without exudates or edema.  No anterior or posterior cervical lymphadenopathy.        Assessment & Plan:

## 2011-02-18 NOTE — Assessment & Plan Note (Signed)
Viral pharyngitis. I advised symptomatic treatment as described in patient instructions below

## 2011-04-25 ENCOUNTER — Ambulatory Visit: Payer: Self-pay | Admitting: Family Medicine

## 2011-05-19 ENCOUNTER — Encounter: Payer: Self-pay | Admitting: Family Medicine

## 2011-05-19 ENCOUNTER — Ambulatory Visit (INDEPENDENT_AMBULATORY_CARE_PROVIDER_SITE_OTHER): Payer: Self-pay | Admitting: Family Medicine

## 2011-05-19 ENCOUNTER — Other Ambulatory Visit (HOSPITAL_COMMUNITY)
Admission: RE | Admit: 2011-05-19 | Discharge: 2011-05-19 | Disposition: A | Discharge status: Home / Self Care | Payer: Self-pay | Source: Ambulatory Visit | Attending: Family Medicine | Admitting: Family Medicine

## 2011-05-19 VITALS — BP 131/79 | HR 90 | Ht 62.0 in | Wt 212.0 lb

## 2011-05-19 DIAGNOSIS — R87613 High grade squamous intraepithelial lesion on cytologic smear of cervix (HGSIL): Secondary | ICD-10-CM

## 2011-05-19 DIAGNOSIS — Z23 Encounter for immunization: Secondary | ICD-10-CM

## 2011-05-19 DIAGNOSIS — E669 Obesity, unspecified: Secondary | ICD-10-CM

## 2011-05-19 DIAGNOSIS — Z01419 Encounter for gynecological examination (general) (routine) without abnormal findings: Secondary | ICD-10-CM | POA: Insufficient documentation

## 2011-05-19 DIAGNOSIS — Z0000 Encounter for general adult medical examination without abnormal findings: Secondary | ICD-10-CM

## 2011-05-19 LAB — HIV ANTIBODY (ROUTINE TESTING W REFLEX): HIV: NONREACTIVE

## 2011-05-19 NOTE — Progress Notes (Signed)
  Subjective:    Patient ID: Catherine Austin, female    DOB: 02-06-1971, 41 y.o.   MRN: [id number]  HPI Patient here for annual exam:  Past medical history, surgical history, family history, social history, meds and allergies all updated in the chart.  Health maintenance: Patient agrees to have lipid panel and A1c screened in the setting of possible metabolic syndrome. Agrees to flu vaccine Agrees to STD screen today.  H/o abnormal pap: Patient has history of abnormal Pap-agrees to have repeat Pap smear. longstanding history of abnormal Pap smears since 2007, status post LEEP procedure in July 2011  who had another LEEP procedure in April 2012 after an abnormal Pap  smear in January 2012 and colposcopy demonstrated high-grade squamous  intraepithelial lesion CIN II on June 10, 2010.  Will obtain repeat pap today.        Review of Systems As per above. No chest pain. Was breath. No bowel pain. No decreased energy. No symptoms of depression. No rash. No fever. No vaginal discharge.    Objective:   Physical Exam  Constitutional: She appears well-developed and well-nourished.       overweight  HENT:  Head: Normocephalic and atraumatic.  Eyes: Pupils are equal, round, and reactive to light.  Neck: No thyromegaly present.  Cardiovascular: Normal rate, regular rhythm and normal heart sounds.   No murmur heard. Pulmonary/Chest: Effort normal and breath sounds normal. No respiratory distress. She has no wheezes. She has no rales.  Abdominal: Soft. She exhibits no distension. There is no tenderness. There is no rebound.  Genitourinary: Vagina normal and uterus normal. No breast swelling, tenderness, discharge or bleeding. There is no rash, tenderness, lesion or injury on the right labia. There is no rash, tenderness, lesion or injury on the left labia. Cervix exhibits no motion tenderness, no discharge and no friability. Right adnexum displays no mass, no tenderness and no fullness.  Left adnexum displays no mass, no tenderness and no fullness.  Musculoskeletal: She exhibits no edema.  Neurological: She is alert.  Psychiatric: She has a normal mood and affect. Her behavior is normal.          Assessment & Plan:

## 2011-05-20 LAB — GC/CHLAMYDIA PROBE AMP, GENITAL: GC Probe Amp, Genital: NEGATIVE

## 2011-05-20 LAB — RPR

## 2011-05-23 ENCOUNTER — Encounter: Payer: Self-pay | Admitting: Family Medicine

## 2011-05-24 ENCOUNTER — Encounter: Payer: Self-pay | Admitting: Family Medicine

## 2011-05-24 DIAGNOSIS — Z0000 Encounter for general adult medical examination without abnormal findings: Secondary | ICD-10-CM | POA: Insufficient documentation

## 2011-05-24 NOTE — Assessment & Plan Note (Signed)
Health maintenance: Patient agrees to have lipid panel and A1c screened in the setting of possible metabolic syndrome. Agrees to flu vaccine STD screen today. Tubal ligation in place for birth control  H/o abnormal pap: Patient has history of abnormal Pap-agrees to have repeat Pap smear.

## 2011-05-24 NOTE — Assessment & Plan Note (Signed)
Encouraged health diet and exercise.

## 2011-05-24 NOTE — Assessment & Plan Note (Signed)
Repeat pap smear today °

## 2011-07-08 ENCOUNTER — Encounter (HOSPITAL_COMMUNITY): Payer: Self-pay

## 2011-07-08 ENCOUNTER — Emergency Department (INDEPENDENT_AMBULATORY_CARE_PROVIDER_SITE_OTHER)
Admission: EM | Admit: 2011-07-08 | Discharge: 2011-07-08 | Disposition: A | Discharge status: Home / Self Care | Payer: Self-pay | Source: Home / Self Care | Attending: Family Medicine | Admitting: Family Medicine

## 2011-07-08 DIAGNOSIS — N946 Dysmenorrhea, unspecified: Secondary | ICD-10-CM

## 2011-07-08 LAB — POCT I-STAT, CHEM 8
Glucose, Bld: 84 mg/dL (ref 70–99)
HCT: 40 % (ref 36.0–46.0)
Hemoglobin: 13.6 g/dL (ref 12.0–15.0)
Potassium: 3.7 mEq/L (ref 3.5–5.1)

## 2011-07-08 LAB — POCT URINALYSIS DIP (DEVICE)
Glucose, UA: NEGATIVE mg/dL
Nitrite: NEGATIVE
Urobilinogen, UA: 0.2 mg/dL (ref 0.0–1.0)

## 2011-07-08 MED ORDER — NAPROXEN 500 MG PO TABS: 500.0000 mg | ORAL_TABLET | Freq: Two times a day (BID) | ORAL | Status: AC

## 2011-07-08 NOTE — Discharge Instructions (Signed)
Su exam de embarazo es negative, Sus examenes de [place] y hemoglobina son normales. Puede tomar el [employer] he prescrito como se le indico para las Arboriculturist. Debe ir al hospital [facility] si persiste el sangramiento por mas de una semana o antes si empeora.  Ademas debe hacer una visita de seguimiento con su medico primario o en el hospital [facility] (vea informacion de contacto arriba) para un ultrasonido pelvico para estudiar la causa de su sangramiento irregular.

## 2011-07-08 NOTE — ED Notes (Signed)
Patient also states that she has had more bleeding vaginally then normal on her period ; LMP 07/06/11

## 2011-07-08 NOTE — ED Notes (Signed)
Patient states that she has been having more than normal bleeding along with stomach cramps and dizziness. These symptoms started 3 days ago. Took tylenol yesterday with no relief; did not take anything today for the pain. Patient states that her pain level is 10 on scale of (0-10).

## 2011-07-08 NOTE — ED Provider Notes (Signed)
History     CSN: [id number]  Arrival date & time 07/08/11  1520   First MD Initiated Contact with Patient 07/08/11 1622      Chief Complaint  Patient presents with  . Dizziness    (Consider location/radiation/quality/duration/timing/severity/associated sxs/prior treatment) HPI Comments: 41 year old obese female with no significant past medical history. Comments complaining of dizziness, nausea and menstrual cramping for 3 days. Patient states her period is late one week. started 3 days ago. She is status post bilateral tubal ligation. Her last Pap smear was January this year and it was non reactive also had negative GC/CHl. Same partner, denies vaginal discharge prior the beginning of her menstrual period. States her period has been heavier than usual this time she has used 3 pads in the last 12 hours, no soaked. Has felt nauseous. No dysuria or dyspareunia. No fever or chills. No history of anemia or hemoglobin abnormalities. Taking Tylenol for her symptoms. Pregnancy test negative.   Past Medical History  Diagnosis Date  . 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 Tubal ligation 10/16/2001    Family History  Problem Relation Age of Onset  . Colon cancer      History  Substance Use Topics  . Smoking status: Never Smoker   . Smokeless tobacco: Not on file  . Alcohol Use: No    OB History    Grav Para Term Preterm Abortions TAB SAB Ect Mult Living                  Review of Systems  Constitutional: Negative for fever, chills and appetite change.  HENT: Negative for congestion, sore throat and rhinorrhea.   Gastrointestinal: Positive for nausea. Negative for vomiting, abdominal pain, diarrhea, constipation and blood in stool.  Genitourinary: Positive for vaginal bleeding and menstrual problem. Negative for dysuria, frequency, flank pain, vaginal discharge, difficulty urinating and dyspareunia.    Skin: Negative for rash.  Neurological: Positive for dizziness.    Allergies  Review of patient's allergies indicates no known allergies.  Home Medications   Current Outpatient Rx  Name Route Sig Dispense Refill  . LORATADINE 10 MG PO TABS Oral Take 10 mg by mouth daily. For itching rash/allergies. Instructions in spanish please     . NAPROXEN 500 MG PO TABS Oral Take 1 tablet (500 mg total) by mouth 2 (two) times daily. 30 tablet 0    BP 128/65  Pulse 73  Temp(Src) 98.2 F (36.8 C) (Oral)  Resp 20  SpO2 99%  LMP 07/06/2011  Physical Exam  Nursing note and vitals reviewed. Constitutional: She is oriented to person, place, and time. She appears well-developed and well-nourished. No distress.  HENT:  Head: Normocephalic and atraumatic.  Mouth/Throat: Oropharynx is clear and moist. No oropharyngeal exudate.  Eyes: Conjunctivae are normal. Pupils are equal, round, and reactive to light.  Neck: Neck supple. No thyromegaly present.  Cardiovascular: Normal heart sounds.   Pulmonary/Chest: Breath sounds normal.  Abdominal: Soft. Bowel sounds are normal. She exhibits no distension and no mass. There is no tenderness. There is no rebound and no guarding.       Obese. No CVT  Lymphadenopathy:    She has no cervical adenopathy.  Neurological: She is alert and oriented to person, place, and time.  Skin: No rash noted.    ED Course  Procedures (including critical care time)  Labs Reviewed  POCT  URINALYSIS DIP (DEVICE) - Abnormal; Notable for the following:    Hgb urine dipstick LARGE (*)    Protein, ur 30 (*)    All other components within normal limits  POCT I-STAT, CHEM 8 - Abnormal; Notable for the following:    Creatinine, Ser 0.40 (*)    All other components within normal limits  POCT PREGNANCY, URINE   No results found.   1. Dysmenorrhea       MDM  Impress dysmenorrhea. Normal physical exam and vital signs. Normal hemoglobin and electrolites and negative  pregnancy test. No vaginal discharge. Prescribed naproxen. Asked to followup with primary care provider or [facility]. [facility] worsening or persistent bleeding.        Sharin Grave, MD 07/09/11 1440

## 2011-08-08 ENCOUNTER — Ambulatory Visit: Payer: Self-pay | Admitting: Family Medicine

## 2011-09-01 ENCOUNTER — Ambulatory Visit (INDEPENDENT_AMBULATORY_CARE_PROVIDER_SITE_OTHER): Payer: Self-pay | Admitting: Family Medicine

## 2011-09-01 ENCOUNTER — Encounter: Payer: Self-pay | Admitting: Family Medicine

## 2011-09-01 VITALS — BP 113/72 | HR 88 | Temp 98.3°F | Ht 62.0 in | Wt 215.0 lb

## 2011-09-01 DIAGNOSIS — J029 Acute pharyngitis, unspecified: Secondary | ICD-10-CM

## 2011-09-01 NOTE — Progress Notes (Signed)
  Subjective:    Patient ID: Catherine Austin, female    DOB: March 07, 1971, 41 y.o.   MRN: [id number]  HPI Viral symptoms: Patient reports sore throat, headache off and on, cough off and on, itchy eyes-x1 week. Occasional fever, body aches, positive chills, positive runny nose. Positive chest congestion and nasal congestion.  Patient would like to be checked for strep infection. No nausea. No vomiting. No diarrhea. No abdominal pain. No urinary problems. No bowel problems. Eating well. Pretty well. Positive fatigue over the past week. Difficult to work because feeling tired.  Smoking status reviewed.  Interview conducted in [place].   Review of Systems As per above.    Objective:   Physical Exam  Constitutional: She appears well-developed and well-nourished.  HENT:  Head: Normocephalic and atraumatic.  Right Ear: External ear normal.  Left Ear: External ear normal.  Mouth/Throat: Oropharynx is clear and moist.       Mild erythema of posterior pharnyx. No edema. No ulcers.   Eyes: Conjunctivae are normal. Pupils are equal, round, and reactive to light. Right eye exhibits no discharge. Left eye exhibits no discharge.  Neck: Normal range of motion. Neck supple.  Cardiovascular: Normal rate, regular rhythm and normal heart sounds.   No murmur heard. Pulmonary/Chest: Effort normal and breath sounds normal. No respiratory distress. She has no wheezes. She has no rales.  Abdominal: Soft.  Musculoskeletal: She exhibits no edema.  Lymphadenopathy:    She has no cervical adenopathy.  Neurological: She is alert.  Skin: No rash noted.  Psychiatric: She has a normal mood and affect. Her behavior is normal.          Assessment & Plan:

## 2011-09-01 NOTE — Patient Instructions (Signed)
Por el tos: mucinex  El dolor del garganta: Tylenol y motrin chloraseptic  El dolor de cabeza: Tylenol o motrin.

## 2011-09-01 NOTE — Assessment & Plan Note (Signed)
Symptoms most consistent with viral uri.  Strep test negative.  Reviewed symptomatic treatment.  Reviewed red flags for return with patient.  Pt to Return if no improvement or if new or worsening of symptoms.

## 2011-11-24 ENCOUNTER — Ambulatory Visit: Payer: Self-pay | Admitting: Family Medicine

## 2011-11-28 ENCOUNTER — Encounter: Payer: Self-pay | Admitting: Family Medicine

## 2011-11-28 ENCOUNTER — Ambulatory Visit (INDEPENDENT_AMBULATORY_CARE_PROVIDER_SITE_OTHER): Payer: Self-pay | Admitting: Family Medicine

## 2011-11-28 VITALS — BP 128/81 | HR 73 | Temp 97.9°F | Wt 216.0 lb

## 2011-11-28 DIAGNOSIS — N912 Amenorrhea, unspecified: Secondary | ICD-10-CM

## 2011-11-28 DIAGNOSIS — J069 Acute upper respiratory infection, unspecified: Secondary | ICD-10-CM

## 2011-11-28 DIAGNOSIS — J029 Acute pharyngitis, unspecified: Secondary | ICD-10-CM

## 2011-11-28 LAB — POCT URINE PREGNANCY: Preg Test, Ur: NEGATIVE

## 2011-11-28 MED ORDER — BENZONATATE 100 MG PO CAPS: 100.0000 mg | ORAL_CAPSULE | Freq: Two times a day (BID) | ORAL | Status: AC | PRN

## 2011-11-28 MED ORDER — FLUTICASONE PROPIONATE 50 MCG/ACT NA SUSP: 2.0000 | Freq: Every day | NASAL | Status: DC

## 2011-11-28 NOTE — Assessment & Plan Note (Signed)
Rapid strep performed.  Awaiting results. Conservative management with Tessalon perles, Flonase, Cepacol cough drops, rest, and fluids. Red Flags per AVS.

## 2011-11-28 NOTE — Patient Instructions (Addendum)
It was nice to meet you today. If results of strep test is positive, I will call you.  Please pick up prescriptions at your pharmacy. Take Tessalon Perles as needed for cough. Use Flonase as needed for nasal congestion. Please purchase over-the-counter CEPACOL cough drops for sore throat.  If symptoms worsen, if he develop fevers temperature over 101.5 degrees, nausea or vomiting, decreased appetite, please return to clinic.  Remember to drink plenty of fluids, get any rest.  Upper Respiratory Infection, Adult An upper respiratory infection (URI) is also known as the common cold. It is often caused by a type of germ (virus). Colds are easily spread (contagious). You can pass it to others by kissing, coughing, sneezing, or drinking out of the same glass. Usually, you get better in 1 or 2 weeks.  HOME CARE   Only take medicine as told by your doctor.   Use a warm mist humidifier or breathe in steam from a hot shower.   Drink enough water and fluids to keep your pee (urine) clear or pale yellow.   Get plenty of rest.   Return to work when your temperature is back to normal or as told by your doctor. You may use a face mask and wash your hands to stop your cold from spreading.  GET HELP RIGHT AWAY IF:   After the first few days, you feel you are getting worse.   You have questions about your medicine.   You have chills, shortness of breath, or brown or red spit (mucus).   You have yellow or brown snot (nasal discharge) or pain in the face, especially when you bend forward.   You have a fever, puffy (swollen) neck, pain when you swallow, or white spots in the back of your throat.   You have a bad headache, ear pain, sinus pain, or chest pain.   You have a high-pitched whistling sound when you breathe in and out (wheezing).   You have a lasting cough or cough up blood.   You have sore muscles or a stiff neck.  MAKE SURE YOU:   Understand these instructions.   Will watch your  condition.   Will get help right away if you are not doing well or get worse.  Document Released: 09/21/2007 Document Revised: 03/24/2011 Document Reviewed: 08/09/2010 [facility] Patient Information 2012 [place], [place].

## 2011-11-28 NOTE — Progress Notes (Signed)
  Subjective:     Catherine Austin is a 41 y.o. female who presents for evaluation of symptoms of a URI. Symptoms include cough described as productive of green sputum, nasal congestion, purulent nasal discharge, sinus pressure and sore throat. Onset of symptoms was 4 days ago, and has been unchanged since that time. Treatment to date: none.   She complains of decreased appetite, but drinking fluids well.  She vomited one time yesterday at 3 AM.  Denies any fevers at home.   Review of Systems Per HPI  Objective:    BP 128/81  Pulse 73  Temp 97.9 F (36.6 C) (Oral)  Wt 216 lb (97.977 kg) General appearance: alert, cooperative and no distress Head: Normocephalic, without obvious abnormality, atraumatic, tenderness on palpation of cervical lymph nodes Ears: normal TM's and external ear canals both ears Nose: Nares normal. Septum midline. Mucosa normal. No drainage or sinus tenderness. Throat: lips, mucosa, and tongue normal; oropharynx mild erythema but no exuduate Lungs: clear to auscultation bilaterally Heart: regular rate and rhythm, S1, S2 normal, no murmur, click, rub or gallop Skin: Skin color, texture, turgor normal. No rashes or lesions   Assessment:    viral upper respiratory illness   Plan:    Problem List

## 2012-07-20 ENCOUNTER — Encounter: Payer: Self-pay | Admitting: Family Medicine

## 2012-08-15 ENCOUNTER — Other Ambulatory Visit (HOSPITAL_COMMUNITY)
Admission: RE | Admit: 2012-08-15 | Discharge: 2012-08-15 | Disposition: A | Discharge status: Home / Self Care | Payer: Self-pay | Source: Ambulatory Visit | Attending: Family Medicine | Admitting: Family Medicine

## 2012-08-15 ENCOUNTER — Encounter: Payer: Self-pay | Admitting: Family Medicine

## 2012-08-15 ENCOUNTER — Ambulatory Visit (INDEPENDENT_AMBULATORY_CARE_PROVIDER_SITE_OTHER): Payer: Self-pay | Admitting: Family Medicine

## 2012-08-15 VITALS — BP 112/49 | HR 88 | Ht 62.0 in | Wt 223.0 lb

## 2012-08-15 DIAGNOSIS — Z124 Encounter for screening for malignant neoplasm of cervix: Secondary | ICD-10-CM

## 2012-08-15 DIAGNOSIS — Z01419 Encounter for gynecological examination (general) (routine) without abnormal findings: Secondary | ICD-10-CM | POA: Insufficient documentation

## 2012-08-15 DIAGNOSIS — R87613 High grade squamous intraepithelial lesion on cytologic smear of cervix (HGSIL): Secondary | ICD-10-CM

## 2012-08-15 DIAGNOSIS — Z0000 Encounter for general adult medical examination without abnormal findings: Secondary | ICD-10-CM

## 2012-08-15 LAB — LIPID PANEL
HDL: 38 mg/dL — ABNORMAL LOW (ref >39)
LDL Cholesterol: 72 mg/dL (ref 0–99)
Triglycerides: 154 mg/dL — ABNORMAL HIGH (ref <150)
VLDL: 31 mg/dL (ref 0–40)

## 2012-08-15 LAB — COMPREHENSIVE METABOLIC PANEL
Alkaline Phosphatase: 132 U/L — ABNORMAL HIGH (ref 39–117)
BUN: 9 mg/dL (ref 6–23)
Creat: 0.69 mg/dL (ref 0.50–1.10)
Glucose, Bld: 90 mg/dL (ref 70–99)
Sodium: 139 mEq/L (ref 135–145)
Total Bilirubin: 0.3 mg/dL (ref 0.3–1.2)

## 2012-08-15 LAB — CBC
HCT: 34.7 % — ABNORMAL LOW (ref 36.0–46.0)
Hemoglobin: 11.4 g/dL — ABNORMAL LOW (ref 12.0–15.0)
MCH: 25.9 pg — ABNORMAL LOW (ref 26.0–34.0)
MCHC: 32.9 g/dL (ref 30.0–36.0)
MCV: 78.9 fL (ref 78.0–100.0)

## 2012-08-15 NOTE — Assessment & Plan Note (Signed)
Will obtain CBC, CMET, and lipid panel today.  Follow up in one year or sooner as needed.

## 2012-08-15 NOTE — Progress Notes (Signed)
  Subjective:     Catherine Austin is a 42 y.o. female and is here for a comprehensive physical exam. The patient reports intermittent headaches and feeling "warm."  This occurs every time she has her period.  She continues to have periods.  She applies cold compresses to head which helps relieve symptoms.  She has a hx of HSIL status post LEEP in 2012 and a negative pap in 2013.  History   Social History  . Marital Status: Single, but has a boyfriend    Spouse Name: N/A    Number of Children: 5  . Years of Education: N/A   Occupational History  . Not on file.   Social History Main Topics  . Smoking status: Never Smoker   . Smokeless tobacco: Not on file  . Alcohol Use: No  . Drug Use: No  . 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 Influenza Vaccine  12/17/2012  . Pap Smear  05/18/2014  . Tetanus/tdap  02/06/2018    Review of Systems Pertinent items are noted in HPI.   Objective:    BP 112/49  Pulse 88  Ht 5\' 2"  (1.575 m)  Wt 223 lb (101.152 kg)  BMI 40.78 kg/m2 General appearance: alert, cooperative and no distress Head: Normocephalic, without obvious abnormality, atraumatic Eyes: conjunctivae/corneas clear. PERRL, EOM's intact. Fundi benign. Throat: lips, mucosa, and tongue normal; teeth and gums normal Lungs: clear to auscultation bilaterally Heart: regular rate and rhythm, S1, S2 normal, no murmur, click, rub or gallop Abdomen: soft, non-tender; bowel sounds normal; no masses,  no organomegaly Pelvic: cervix normal in appearance, external genitalia normal, no adnexal masses or tenderness, no cervical motion tenderness, uterus normal size, shape, and consistency and vagina normal without discharge Extremities: extremities normal, atraumatic, no cyanosis or edema Skin: Skin color, texture, turgor normal. No  rashes or lesions    Assessment:    Healthy female exam with Pap and routine labs.     Plan:    See Problem list. See After Visit Summary for Counseling Recommendations

## 2012-08-15 NOTE — Patient Instructions (Addendum)
Cuidados preventivos en las mujeres adultas  (Preventive Care for Adults, Female) Un estilo de vida saludable y los cuidados preventivos pueden favorecer la salud y [place]. Las guas para Engineer, building services salud para las mujeres incluyen las siguientes prcticas clave:   Un examen fsico de rutina anual es un buen modo de Chief Operating Officer su salud y Education officer, environmental estudios preventivos. Le da la posibilidad de Agricultural consultant preocupaciones y Solicitor el estado de su salud, y que le realicen estudios completos.  Consulte al dentista para realizar un examen de rutina y cuidados preventivos cada 6 meses. Cepllese los dientes al [employer] veces por da y psese el hilo dental al menos una vez por da. Una buena higiene bucal evita caries y enfermedades de las encas.  La frecuencia con que debe hacerse exmenes de la vista depende de la edad, el estado de Guadalupe Guerra, la historia familiar, el uso de lentes de contacto y otros factores. Siga las recomendaciones del mdico para saber con qu frecuencia debe hacerse exmenes de la vista.  Consuma una dieta saludable. Los [employer] Microsystems, frutas, granos enteros, productos lcteos descremados y protenas magras contienen los nutrientes que usted necesita sin necesidad de consumir muchas caloras. Disminuya el consume de alimentos con alto contenido de grasas slidas, azcar y sal agregadas. Consuma la cantidad de caloras adecuada para usted.Si es necesario, pdale una dieta adecuada al profesional que lo asiste.  La actividad fsica regular es una de las cosas ms importantes que puede hacer por su salud. Los adultos deben hacer al menos 150 minutos de ejercicios de [place] de intensidad moderada (toda actividad que aumente la frecuencia cardaca y lo haga transpirar) cada semana. Adems, la [employer]ley[employer]son de los adultos necesita ejercicios de fortalecimiento muscular 2  ms [employer].  Hay que mantener un peso saludable. El ndice de masa corporal [facility]) es una  herramienta que identifica posibles problemas con [place]. Proporciona una estimacin de la grasa corporal basndose en el peso y la altura. El mdico podr determinar si [facility] y podr ayudarlo a Personnel officer o Pharmacologist un peso saludable.Para los adultos de 20 aos o ms:  Un [facility] menor a 18,5 se considera bajo peso.  Un [facility] entre 18,5 y 24,9 es normal.  Un [facility] entre 25 y 29,9 es sobrepeso.  Un IMC entre 30 o ms es obesidad.  Mantenga un nivel normal de lpidos y colesterol en sangre practicando actividad fsica y minimizando la ingesta de grasas saturadas. Consuma una dieta balanceada y saludable, e incluya variedad de frutas y vegetales. Los [employer] de lpidos y Oncologist en sangre deben Games developer a los 20 aos y repetirse cada 5 aos. Si los niveles de colesterol son altos, tiene ms de 50 aos o tiene riesgo elevado de sufrir enfermedades cardacas necesitar controlarse con ms frecuencia.Si tiene [employer] de lpidos y colesterol, debe recibir tratamiento con medicamentos, si la dieta y el ejercicio no son efectivos.  Si fuma, consulte con [place] [employer] de las opciones para dejar de [place]. Si no lo hace, no comience.  Si est embarazada no beba alcohol. Si est amamantando, beba alcohol con prudencia. Si elige beber alcohol, no se exceda de 1 medida por da. Se considera una medida a 12 onzas (355 ml) de cerveza, 5 onzas (148 ml) de vino, o 1,5 onzas (44 ml) de licor.  Evite el alcohol y el consumo de drogas. No comparta agujas. Pida ayuda si necesita asistencia o instrucciones con respecto a abandonar el consumo de alcohol, cigarrillos o  drogas.  La hipertensin arterial causa enfermedades cardacas y [place] el riesgo de ictus. Debe controlar su presin arterial al menos cada 1 o 2 aos. La presin arterial elevada que persiste debe tratarse con medicamentos si la prdida de peso y el ejercicio no son efectivos.  Si tiene entre 55 y 41 aos, consulte a su mdico si debe tomar  aspirina para prevenir enfermedades cardacas.  Los anlisis para la diabetes incluyen la toma de [place] de sangre para controlar el nivel de azcar en la sangre durante el [place]. Debe hacerlo cada 3 aos despus de los 45 aos si est dentro de su peso normal y sin factores de riesgo para la diabetes. Las pruebas deben comenzar a edades tempranas o llevarse a cabo con ms frecuencia si tiene sobrepeso y al menos 1 factor de riesgo para la diabetes.  Las evaluaciones para Market researcher de mama son un mtodo preventivo fundamental para las mujeres. Debe practicar la "autoconciencia de las mamas". Esto significa que debe comprender como es la apariencia normal y como se sienten sus mamas e incluir un autoexamen. Si detecta algn cambio, no importa cun pequeo sea, debe informarlo a su mdico. Las mujeres entre 20 y 30 aos deben hacer un examen clnico de las mamas como parte del examen regular de [place], cada 1 a 3 aos. Despus de los 40 aos deben [employer]. Deben hacerse una mamografa cada ao, comenzando a los 40 aos. Las mujeres con Brewing technologist de cncer de mama deben hablar con el mdico para hacer un estudio gentico. Las que tienen ms riesgo deben hacerse una ecografa y [place] mamografa todos [place].  Un papanicolau se realiza para diagnosticar cncer de cuello de tero. Muestra los cambios celulares en el cuello que pueden transformarse en cncer si no se tratan. El papanicolau es un procedimiento por el que se obtienen clulas de la parte inferior del tero (cuello) y son examinadas.  Las mujeres deben [employer] un papanicolau a Glass blower/designer de los [address].  [place] 21 y los 29 aos debe repetirse [address].  Luego de los 30 aos, debe realizarse un Papanicolaou cada tres aos siempre que los 3 estudios anteriores sean normales.  Algunas mujeres sufren problemas mdicos que aumenta la probabilidad de Research officer, political party cervical. Consulte a su mdico acerca de estos  problemas. Es muy importante que le informe a su mdico si aparecen nuevos problemas poco despus de su ltimo Papanicolaou. En estos casos, el mdico podr indicar que se realice el Papanicolaou con ms frecuencia.  Estas recomendaciones son las mismas para todas las mujeres hayan recibido o no la vacuna para el VPH (virus del papiloma humano).  Si le han realizado una histerectoma por un problema que no era cncer u otra enfermedad que podra causar cncer, ya no necesitar un Papanicolaou. Sin embargo, si ya no necesita hacerse un Papanicolau, es una buena idea hacerse un examen regularmente para asegurarse de que no hay otros problemas.  Si tiene entre 65 y 13 aos y ha tenido un Papanicolaou normal en los ltimos 10 aos, ya no ser Music therapist. Sin embargo, si ya no necesita hacerse un Papanicolau, es una buena idea hacerse un examen regularmente para asegurarse de que no hay otros problemas.  Si ha recibido un tratamiento para Management consultant cervical o para una enfermedad que podra causar cncer, Musician un Papanicolaou y Paediatric nurse durante al menos 20 aos de concluir el [place].  Si no se ha  hecho el examen con regularidad, debern volver a evaluarse los factores de riesgo (como el tener un nuevo compaero sexual) para Occupational psychologist a Printmaker.  La prueba del VPH es un anlisis adicional que puede usarse para Engineer, site de cuello de tero. Esta prueba busca la presencia del virus que causa los cambios en el cuello. Las [employer] se recolectan durante el papanicolau pueden usarse para el VPH. La prueba para el VPH puede usarse para Development worker, community a mujeres de ms de 30 aos y debe usarse en mujeres de cualquier edad Cisco del papanicolau no sean claros. Despus de los 30 aos, las mujeres deben hacerse el anlisis para el VPH con la misma frecuencia que el papanicolau.  El cncer colorectal puede detectarse y con fecuencia puede  prevenirse. La mayor parte de los estudios de rutina comienzan a los 50 aos y [place] 75 aos. Sin embargo, el mdico podr aconsejarle que lo haga antes, si tiene factores de riesgo para el cncer de colon. Una vez por ao, el profesional le dar un kit de prueba para Recruitment consultant oculta en la materia fecal. Ladonna Snide un tubo con Ladon Applebaum cmara en su extremo para examinar directamente el colon (sigmoidoscopa o colonoscopa), para detectar formas temprana de cncer colorectal. Hable con su mdico si tiene 50 aos, cuando comience con los estudios de [place]. El examen directo del colon debe repetirse cada 5 a 10 aos, hasta los 75 aos, excepto que se encuentren formas tempranas de plipos precancerosos o pequeos bultos.  Se recomienda realizar un anlisis de sangre para Engineer, manufacturing hepatitis C a todas las personas [address] 1946 y 1966, y a todo aquel que tenga un riesgo conocido de haber contrado esta enfermedad.  Practique el sexo seguro. Use condones y evite las prcticas sexuales riesgosas para disminuir el contagio de enfermedades de transmisin sexual. Las enfermedades de transmisin sexual son la gonorrea, clamidia, sfilis, tricomonas, herpes, VPH y el virus de inmunodeficiencia humana (VIH). El herpes, el VIH y Presho VPH son enfermedades virales que no tienen [place]. Pueden producir discapacidad, cncer y [employer]. Las mujeres sexualmente activas de 25 aos o menos deben ser evaluadas para Engineer, manufacturing clamidia. Las Coca [employer]a que tengan mltiples compaeros tambin deben hacerse el anlisis para Engineer, manufacturing clamidia. Se recomienda realizar anlisis para detectar otras enfermedades de transmisin sexual si es sexualmente [place] y tiene riesgos.  La osteoporosis es una enfermedad en la que los huesos pierden los minerales y la fuerza por el avance de la edad. El resultado pueden ser fracturas graves en los [place]. El riesgo de osteoporosis puede identificarse con Neomia Dear prueba de  densidad sea. Las mujeres de ms de 65 aos y las que tengan riesgos de sufrir fracturas u osteoporosis deben pedir consejo a su mdico. Consulte a su mdico si debe tomar un suplemento de calcio o de vitamina D para reducir el riesgo de osteoporosis.  La menopausia se asocia a sntomas y riesgos fsicos. Se dispone de una terapia de reemplazo hormonal para disminuir los sntomas y [place]. Consulte a su mdico para saber si la terapia de reemplazo hormonal es conveniente para usted.  Use una pantalla solar con un factor SPF de 30 o mayor. Aplique pantalla de Pietro Cassis y repetida a lo largo del Futures trader. Pngase al resguardo del sol cuando la sombra sea ms pequea que usted. Protjase usando mangas y [place], un sombrero de ala ancha y gafas para el sol todo [place],  siempre que se encuentre en el exterior.  Una vez por mes hgase un examen de la piel de todo el cuerpo usando un espejo para ver la espalda. nforme al mdico si aparecen nuevos lunares, los que ya estn tienen bordes [address], los que sean ms grandes que una goma de lpiz o los que hayan cambiado su forma o color.  Mantngase al da con las vacunas.  Gripe: Debe aplicarse una dosis todos en cada otoo (o invierno). La composicin de la vacuna de la gripe cambia todos los [place], por lo tanto no es suficiente con vacunarse una vez.  Vacuna antineumocccica de polisacridos Debe aplicarse 1  2 dosis si fuma o si sufre ciertas enfermedades crnicas. Necesitar 1 dosis a los 65 aos (o ms) si nunca se ha vacunado.  Vacuna difteria, ttanos, tos convulsa (DTP). Aplquese una dosis de la vacuna DTaP (vacuna contra la tos convulsa para adultos) si es menor de 65 aos, si tiene ms de 65 aos y est en contacto con un beb, es un trabajador de la [place], es una mujer embarazada o simplemente quiere estar protegido de la enfermedad. Luego necesitar un refuerzo de DT cada 10 aos. Consulte con su mdico si no ha recibido al menos  3 dosis de la vacuna contra el ttanos (y la difteria) en algn momento de su vida o tiene una herida profunda o sucia.  VPH: Debe aplicarse esta vacuna si tiene 26 aos o menos. La vacuna se administra en 3 dosis, generalmente durante el curso de 6 meses.  MMR (sarampin, paperas, rubola) Debe aplicarse al menos una dosis de MMR si ha nacido en 1957 o despus. Podra tambin necesitar una segunda dosis.  Antimeningocccica Si tiene entre 19 y 50 aos y es un estudiante universitario de Therapist, occupational que vive en una residencia estudiantil, o tiene alguna enfermedad mdica, debe recibir esta vacuna. Podra tambin necesitar dosis de refuerzo.  Herpes zoster (culebrilla). Si tiene 60 aos o ms debe aplicarse esta vacuna ahora.  Varicela Si nunca se vacun o slo recibi una dosis, hable con su mdico para averiguar si necesita aplicarse esta vacuna.  Hepatitis A. Debe aplicarse esta vacuna si tiene un factor de riesgo especfico para contraer una infeccin por el virus de la hepatitis A, o simplemente desea estar protegido contra la enfermedad. La vacuna se administra en 2 dosis, con una diferencia entre 6 y 18 meses.  Hepatitis B. Debe aplicarse esta vacuna si tiene un factor de riesgo especfico para contraer una infeccin por el virus de la hepatitis B, o simplemente desea estar protegido contra la enfermedad. La vacuna se administra en 3 dosis, generalmente durante el curso de 6 meses. Controles preventivos - Frecuencia Edad 19 a 39  Control de la presin arterial.** / Cada 1 a 2 aos.  Control de lpidos y colesterol.** / Cada 5 aos, comenzando a los 20 aos.  Examen clnico de mamas.** / Cada 3 aos en las [employer] 20 y los [address] [place].  Papanicolau.** / Cada 2 aos [employer] 21 y los [place]. Despus de los [address] [place], y [place] 65 o 70, con una historia de 3 papanicolau normales consecutivos.  Pruebas para el VPH.** / Cada 3 aos, a partir de los [address] [place], y [place] 65 o 70, con  una historia de 3 papanicolau normales consecutivos.  Anlisis de sangre para la hepatitis C. ** / Para todo individuo con riesgos conocidos para la hepatitis C.  Autoexamen de piel. /  [employer].  Vacuna contra la gripe.** / [employer].  Vacuna antineumocccica de polisacridos.** / Debe aplicarse 1  2 dosis si fuma o si sufre ciertas enfermedades crnicas.  Vacuna difteria, ttanos, tos convulsa (Tdap, Td). / Una dosis nica de vacuna Tdap. Luego necesitar un refuerzo de DT cada 10 aos.  Vacuna Printmaker. / 3 dosis en el curso de 6 meses, si tiene 26 aos o menos.  MMR (sarampin, paperas, rubola). / Debe aplicarse al menos una dosis de MMR si ha nacido en 1957 o despus. Podra tambin necesitar una segunda dosis.  Vacunacin antimeningocccica. / Si tiene entre 55 y 34 aos y es un estudiante universitario de Therapist, occupational que vive en una residencia estudiantil, o tiene alguna enfermedad mdica, debe recibir esta vacuna. Podra tambin necesitar dosis de refuerzo.  Vacuna contra la varicela.** / Consltelo con el mdico.  Vacuna contra la hepatitis A.** / Consltelo con el mdico. 2 dosis, con un intervalo entre 6 a 18 meses.  Vacuna contra la hepatitis B.** / Consltelo con el mdico. 3 dosis en el curso de 6 meses. Edad 40 a 64  Control de la presin arterial.** / Cada 1 a 2 aos.  Control de lpidos y colesterol. **/ Cada 5 aos, comenzando a los 20 aos.  Examen clnico de mamas.** / Todos los aos despus de los 40 aos.  Mamografa.** / Neomia Dear vez por ao a partir de los 40 aos y continuando siempre que tenga buena salud. Consulte con el mdico.  Papanicolau.** / Cada 3 aos despus de los [address] [place], y [place] 65 o 70, con una historia de 3 papanicolau normales consecutivos.  Pruebas para el VPH.** / Cada 3 aos despus de los [address] [place], y [place] 65 o 70, con una historia de 3 papanicolau normales consecutivos.  Prueba de sangre oculta en materia fecal. /  Cada ao comenzando a los 50 aos continuando [employer] 75. No tendr que hacerlo si se ha hecho una colonoscopa cada 10 aos.  Sigmoidoscopa flexible** o colonoscopa.** / Cada 5 aos para la sigmoidoscopa flexible o cada 10 aos para la colonoscopa, comenzando a los 50 aos y continuando [employer] 75 aos.  Anlisis de sangre para la hepatitis C. ** / Para todas las personas [address] 1946 y 46 y a todo aquel que tenga un riesgo conocido para la hepatitis C.  Autoexamen de piel. / Todos los [place].  Vacuna contra la gripe.** / [employer].  Vacuna antineumocccica de polisacridos.** / Debe aplicarse 1  2 dosis si fuma o si sufre ciertas enfermedades crnicas.  Vacuna difteria, ttanos, tos convulsa (Tdap, Td). / Una dosis nica de vacuna Tdap. Luego necesitar un refuerzo de DT cada 10 aos.  MMR (sarampin, paperas, rubola). / Debe aplicarse al menos una dosis de MMR si ha nacido en 1957 o despus. Podra tambin necesitar una segunda dosis.  Vacuna contra la varicela.**/ Consltelo con el mdico.  Vacunacin antimeningocccica. / Consltelo con el mdico.  Vacuna contra la hepatitis A.**/ Consltelo con el mdico. 2 dosis, con un intervalo entre 6 a 18 meses.  Vacuna contra la hepatitis B.** / Consltelo con el mdico. 3 dosis en el curso de 6 meses. Edad 65 o ms  Control de la presin arterial.** / Cada 1 a 2 aos.  Control de lpidos y colesterol. **/ Cada 5 aos, comenzando a los 20 aos.  Examen clnico de mamas.** / Todos los aos despus de los 40  aos.  Ileene Musa.** / Neomia Dear vez por ao a partir de los 40 aos y continuando siempre que tenga buena salud. Consulte con el mdico.  Papanicolau. ** / Cada 3 aos despus de los [address] [place], y [place] 65 o 70, con una historia de 3 papanicolau normales consecutivos. Las pruebas pueden [employer] 65 y los 70 aos, si tiene 3 papanicolau consecutivos normales y no tuvo un papanicoalu ni prueba de VPH [employer] ltimos 10 aos.  Pruebas para el VPH.** / Cada 3 aos despus de los [address] [place], y [place] 65 o 70, con una historia de 3 papanicolau normales consecutivos. Las pruebas pueden [employer] 65 y los 70 aos, si tiene 3 papanicolau consecutivos normales y no tuvo un papanicoalu ni prueba de VPH [employer] ltimos 10 aos.  Prueba de sangre oculta en materia fecal. / Cada ao comenzando a los 50 aos continuando [employer] 75. No tendr que hacerlo si se ha hecho una colonoscopa cada 10 aos.  Sigmoidoscopa flexible** o colonoscopa.** / Cada 5 aos para la sigmoidoscopa flexible o cada 10 aos para la colonoscopa, comenzando a los 50 aos y continuando [employer] 75 aos.  Anlisis de sangre para la hepatitis C. ** / Para todas las personas [address] 1946 y 83 y a todo aquel que tenga un riesgo conocido para la hepatitis C.  Pruebas para la osteoporosis.** / Por nica vez en mujeres de ms de 65 aos que tengan riesgo de fracturas u osteoporosis.  Autoexamen de piel. / Todos los [place].  Vacuna contra la gripe.** / [employer].  Vacuna antineumocccica de polisacridos.** / Necesitar 1 dosis a los 65 aos (o ms) si nunca se ha vacunado.  Vacuna difteria, ttanos, tos convulsa (Tdap, Td). / Aplquese una dosis de la vacuna DTaP (vacuna contra la tos convulsa para adultos) si tiene ms de 65 aos y est en contacto con un beb, es un trabajador de [address], es una mujer embarazada o simplemente quiere estar protegido de la enfermedad. Luego necesitar un refuerzo de DT cada 10 aos.  Vacuna contra la varicela.**/ Consltelo con el mdico.  Vacunacin antimeningocccica.** / Consltelo con el mdico.  Vacuna contra la hepatitis A.** / Consltelo con el mdico. 2 dosis, con un intervalo entre 6 a 18 meses.  Vacuna contra la hepatitis B.** / Consulte con el mdico. 3 dosis en el curso de 6 meses. **La historia familiar y personal de riesgos y enfermedades puede  cambiar las recomendaciones del mdico. Document Released: 01/12/2005 Document Revised: 06/27/2011 [facility] Patient Information 2013 [place], [place].

## 2012-08-15 NOTE — Assessment & Plan Note (Signed)
Last pap smear in 04/2011 was negative.  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  Will notify patient of results.

## 2012-08-15 NOTE — Progress Notes (Signed)
Interpreter Wyvonnia Dusky for Dr Tye Savoy

## 2012-08-21 ENCOUNTER — Telehealth: Payer: Self-pay | Admitting: Family Medicine

## 2012-08-21 NOTE — Telephone Encounter (Signed)
Please relate message,patient only speaks spanish. Thank you. Catherine Austin, [name]

## 2012-08-21 NOTE — Telephone Encounter (Signed)
Please ask a Spanish interpreter to tell patient that lab results and pap smear are normal.  Thank you.

## 2012-08-22 NOTE — Telephone Encounter (Signed)
I tried to called pt but all the phone number that we have in the system are wrong.   [place]

## 2012-10-16 ENCOUNTER — Emergency Department (HOSPITAL_COMMUNITY)
Admission: EM | Admit: 2012-10-16 | Discharge: 2012-10-17 | Disposition: A | Discharge status: Home / Self Care | Payer: No Typology Code available for payment source | Attending: Emergency Medicine | Admitting: Emergency Medicine

## 2012-10-16 ENCOUNTER — Emergency Department (HOSPITAL_COMMUNITY): Payer: No Typology Code available for payment source

## 2012-10-16 DIAGNOSIS — IMO0002 Reserved for concepts with insufficient information to code with codable children: Secondary | ICD-10-CM | POA: Insufficient documentation

## 2012-10-16 DIAGNOSIS — Z3202 Encounter for pregnancy test, result negative: Secondary | ICD-10-CM | POA: Insufficient documentation

## 2012-10-16 DIAGNOSIS — S3981XA Other specified injuries of abdomen, initial encounter: Secondary | ICD-10-CM | POA: Insufficient documentation

## 2012-10-16 DIAGNOSIS — S298XXA Other specified injuries of thorax, initial encounter: Secondary | ICD-10-CM | POA: Insufficient documentation

## 2012-10-16 DIAGNOSIS — S0990XA Unspecified injury of head, initial encounter: Secondary | ICD-10-CM | POA: Insufficient documentation

## 2012-10-16 DIAGNOSIS — Y9389 Activity, other specified: Secondary | ICD-10-CM | POA: Insufficient documentation

## 2012-10-16 DIAGNOSIS — Y92410 Unspecified street and highway as the place of occurrence of the external cause: Secondary | ICD-10-CM | POA: Insufficient documentation

## 2012-10-16 MED ORDER — ACETAMINOPHEN 500 MG PO TABS
1000.0000 mg | ORAL_TABLET | Freq: Once | ORAL | Status: AC
  Administered 2012-10-16: 1000 mg via ORAL
  Filled 2012-10-16: qty 2
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
## 2012-10-17 MED ORDER — PROMETHAZINE HCL 25 MG PO TABS: 25.0000 mg | ORAL_TABLET | Freq: Four times a day (QID) | ORAL | Status: DC | PRN

## 2012-10-17 MED ORDER — METHOCARBAMOL 500 MG PO TABS: 500.0000 mg | ORAL_TABLET | Freq: Two times a day (BID) | ORAL | Status: DC

## 2012-10-17 MED ORDER — HYDROCODONE-ACETAMINOPHEN 5-325 MG PO TABS: 2.0000 | ORAL_TABLET | Freq: Four times a day (QID) | ORAL | Status: DC | PRN

## 2012-10-17 NOTE — ED Notes (Signed)
Patient returned from X-ray. Ambulatory with steady gait.  

## 2012-10-19 NOTE — ED Provider Notes (Signed)
Medical screening examination/treatment/procedure(s) were performed by non-physician practitioner and as supervising physician I was immediately available for consultation/collaboration.  Luisenrique Conran T Tradarius Reinwald, MD 10/19/12 1517 

## 2012-12-26 ENCOUNTER — Telehealth: Payer: Self-pay | Admitting: Family Medicine

## 2012-12-26 NOTE — Telephone Encounter (Signed)
Pt would like a referral to the dentist she has the orange card.

## 2013-02-15 ENCOUNTER — Ambulatory Visit: Payer: Self-pay | Admitting: Family Medicine

## 2013-03-26 ENCOUNTER — Ambulatory Visit (INDEPENDENT_AMBULATORY_CARE_PROVIDER_SITE_OTHER): Payer: No Typology Code available for payment source | Admitting: Family Medicine

## 2013-03-26 ENCOUNTER — Encounter: Payer: Self-pay | Admitting: Family Medicine

## 2013-03-26 VITALS — BP 108/73 | HR 57 | Temp 97.6°F | Ht 62.0 in | Wt 216.0 lb

## 2013-03-26 DIAGNOSIS — J069 Acute upper respiratory infection, unspecified: Secondary | ICD-10-CM

## 2013-03-26 DIAGNOSIS — Z2089 Contact with and (suspected) exposure to other communicable diseases: Secondary | ICD-10-CM

## 2013-03-26 DIAGNOSIS — Z20818 Contact with and (suspected) exposure to other bacterial communicable diseases: Secondary | ICD-10-CM

## 2013-03-26 MED ORDER — DM-GUAIFENESIN ER 30-600 MG PO TB12: 2.0000 | ORAL_TABLET | Freq: Two times a day (BID) | ORAL | Status: DC

## 2013-03-26 NOTE — Patient Instructions (Addendum)
Your strep test was negative. You have a viral upper respiratory infection. I advise you to drink plenty of fluids (your lips were dry), get lots of rest, use nasal saline for the congestion. For your cough, I would advise the medicine I prescribed (but it is over the counter). This can take 1-2 weeks to get better.   Wrote in-see Dr. Jordan Likes soon for your chronic healthcare needs.   Infeccin de las vas areas superiores en los adultos (Upper Respiratory Infection, Adult)  La infeccin de las vas areas superiores tambin se conoce como resfro comn. La causa es un tipo de germen (virus). Los resfros se diseminan fcilmente (son contagiosos). Puede transmitirlo a los dems al besar, toser, estornudar o beber en el mismo vaso. Generalmente se cura en 1 a 2 semanas.  CUIDADOS EN EL HOGAR   Tome la medicacin segn las indicaciones.  Use un humidificador caliente o respire el vapor en una ducha caliente.  Beba gran cantidad de lquido para mantener la orina de tono claro o color amarillo plido.  Debe hacer reposo.  Regrese a su trabajo cuando la temperatura se haya normalizado, o cuando el mdico se lo indique. Puede usar un barbijo y Customer service manager las manos para evitar que el resfro se contagie. SOLICITE AYUDA DE INMEDIATO SI:   Luego de los primeros das siente que empeora en vez de [place].  Tiene dudas relacionadas con los medicamentos.  Siente escalofros, le falta el aire o escupe moco de color marrn o rojo.  Tiene una secrecin nasal de color amarillo o marrn, o siente dolor en el rostro, especialmente cuando se inclina hacia adelante.  Tiene fiebre, siente el cuello hinchado, tiene dolor al tragar u observa manchas blancas en el fondo de la garganta.  Comienza a sentir Herbalist de cabeza intenso o persistente, dolor de odos, en el seno nasal o en el pecho.  Al respirar emite un sonido similar a un silbido (sibilancias).  Siente falta de aire o escupe sangre al  toser.  Tiene dolores musculares o rigidez en el cuello. ASEGRESE DE QUE:   Comprende estas instrucciones.  Controlar su enfermedad.  Solicitar ayuda de inmediato si no mejora o si empeora. Document Released: 09/06/2010 Document Revised: 06/27/2011 [facility] [place] Patient Information 2014 [place], [place].

## 2013-03-26 NOTE — Progress Notes (Signed)
  Tana Conch, MD Phone: [phone]  Subjective:     Catherine Austin is a 42 y.o. female who presents for evaluation of symptoms of a URI. Symptoms include congestion, coryza and cough described as productive of clear sputum. Onset of symptoms was 3 days ago, and has been gradually worsening since that time. Treatment to date: none. Daughter diagnosed with strep throat yesterday and patient states she got sick from her daughter. Some decreased liquid intake.   Past medical history-obesity, history of LGSIL (advised to follow up with PCP)  Review of Systems Denies nausea/vomiting/fever/chills. Endorses fatigue. Denies shortness of breath or chest pain   Objective:    BP 108/73  Pulse 57  Temp(Src) 97.6 F (36.4 C) (Oral)  Ht 5\' 2"  (1.575 m)  Wt 216 lb (97.977 kg)  BMI 39.50 kg/m2 General appearance: alert and cooperative Head: Normocephalic, without obvious abnormality, atraumatic Eyes: conjunctivae/corneas clear. PERRL, EOM's intact.  Ears: normal TM's and external ear canals both ears Nose: clear discharge, moderate congestion, turbinates red, swollen Throat: lips, mucosa, and tongue normal; teeth and gums normal. Lips mildly dry.  Neck: no adenopathy Lungs: clear to auscultation bilaterally Heart: regular rate and rhythm. 2/6 early peaking SEM, increased with lying down Extremities: edema none   Assessment:    viral upper respiratory illness   Plan:    Discussed diagnosis and treatment of URI. Discussed the importance of avoiding unnecessary antibiotic therapy. Suggested symptomatic OTC remedies. Nasal saline spray for congestion. Follow up as needed.  Strep performed due to daughter with strep-this was negative.   Orders Placed This Encounter  Procedures  . Rapid Strep A    Meds ordered this encounter  Medications  . dextromethorphan-guaiFENesin (MUCINEX DM) 30-600 MG per 12 hr tablet    Sig: Take 2 tablets by mouth 2 (two) times daily.    Dispense:  40  tablet    Refill:  0

## 2013-05-03 ENCOUNTER — Encounter: Payer: Self-pay | Admitting: Family Medicine

## 2013-05-03 ENCOUNTER — Ambulatory Visit (INDEPENDENT_AMBULATORY_CARE_PROVIDER_SITE_OTHER): Payer: Self-pay | Admitting: Family Medicine

## 2013-05-03 VITALS — BP 131/58 | HR 76 | Temp 98.6°F | Ht 62.0 in | Wt 218.9 lb

## 2013-05-03 DIAGNOSIS — M79609 Pain in unspecified limb: Secondary | ICD-10-CM

## 2013-05-03 NOTE — Progress Notes (Signed)
   Patient ID: Catherine Austin, female   DOB: [birth year], 43 y.o.   MRN: [id number]  Catherine Abrahamna R Budge is a 43 y.o. female who presents today for toe pain in her on the plantar aspect between her third and fourth toe.   She reports having pain in the plantar aspect between her 3rd and 4th toes. She first experienced the pain 2 months ago. She describes the pain as starting suddenly and with no inciting trauma. She has been going to the gym frequently and this exacerbates her pain. She dose Zumba at the gym. The pain is 10/10 at its worst. She denies any bruising on her foot. The pain occurs when she walks around, is throbbing in nature, and nothing seems to help it. She hasn't tried anything for her pain. She hasn't tried to ice it or use a heat.   Past Medical History  Diagnosis Date  . 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 Never Smoker   Smokeless tobacco  . Not on file    Family History  Problem Relation Age of Onset  . Early death Mother   . Diabetes Father     Current Outpatient Prescriptions on File Prior to Visit  Medication Sig Dispense Refill  . dextromethorphan-guaiFENesin (MUCINEX DM) 30-600 MG per 12 hr tablet Take 2 tablets by mouth 2 (two) times daily.  40 tablet  0   No current facility-administered medications on file prior to visit.    ROS: Per HPI.  All other systems reviewed and are negative.   Physical Exam There were no vitals filed for this visit.  Physical Examination: Gen: NAD, alert, obese female, cooperative with exam MSK: no bruising noted on left foot, full range of motion of third and fourth digit, no clunking experienced on exam, tenderness to palpation, tenderness experienced on plantar aspect. No signs of deformity      Chemistry      Component Value Date/Time   NA 139 08/15/2012 1539   K 4.1 08/15/2012 1539   CL 102 08/15/2012 1539   CO2 28 08/15/2012 1539   BUN 9 08/15/2012 1539   CREATININE 0.69 08/15/2012 1539   CREATININE 0.40* 07/08/2011 1707      Component Value Date/Time   CALCIUM 9.2 08/15/2012 1539   ALKPHOS 132* 08/15/2012 1539   AST 14 08/15/2012 1539   ALT 12 08/15/2012 1539   BILITOT 0.3 08/15/2012 1539      Lab Results  Component Value Date   WBC 7.9 08/15/2012   HGB 11.4* 08/15/2012   HCT 34.7* 08/15/2012   MCV 78.9 08/15/2012   PLT 384 08/15/2012   Lab Results  Component Value Date   TSH 1.060 08/12/2009   No results found for this basename: HGBA1C

## 2013-05-03 NOTE — Patient Instructions (Signed)
Thank you for coming in,   I think you may have a strain in your foot. Please try some over the counter Naproxen for two weeks. Schedule a follow with me after that and if it still occurs we may try a stronger medication or may need to do x-rays.    Please feel free to call with any questions or concerns at any time, at [phone]. --Dr. Jordan LikesSchmitz

## 2013-05-07 ENCOUNTER — Encounter: Payer: Self-pay | Admitting: Family Medicine

## 2013-05-07 DIAGNOSIS — M79609 Pain in unspecified limb: Secondary | ICD-10-CM | POA: Insufficient documentation

## 2013-05-07 NOTE — Assessment & Plan Note (Signed)
Naproxen bid for two weeks. Follow up with me after that. Advised not to preform any work out that puts stain on the foot.  - possible stress fracture vs strain vs morton neuroma  - if painful still after follow up then consider ultram  - check films if still painful without working out.

## 2013-05-20 ENCOUNTER — Ambulatory Visit: Payer: Self-pay | Admitting: Family Medicine

## 2013-05-27 ENCOUNTER — Ambulatory Visit: Payer: Self-pay

## 2013-05-29 ENCOUNTER — Ambulatory Visit: Payer: Self-pay

## 2013-06-04 ENCOUNTER — Ambulatory Visit: Payer: Self-pay

## 2013-06-11 ENCOUNTER — Ambulatory Visit: Payer: Self-pay

## 2013-09-23 ENCOUNTER — Emergency Department (INDEPENDENT_AMBULATORY_CARE_PROVIDER_SITE_OTHER): Payer: Self-pay

## 2013-09-23 ENCOUNTER — Emergency Department (INDEPENDENT_AMBULATORY_CARE_PROVIDER_SITE_OTHER)
Admission: EM | Admit: 2013-09-23 | Discharge: 2013-09-23 | Disposition: A | Discharge status: Home / Self Care | Payer: Self-pay | Source: Home / Self Care | Attending: Emergency Medicine | Admitting: Emergency Medicine

## 2013-09-23 ENCOUNTER — Encounter (HOSPITAL_COMMUNITY): Payer: Self-pay | Admitting: Emergency Medicine

## 2013-09-23 DIAGNOSIS — J209 Acute bronchitis, unspecified: Secondary | ICD-10-CM

## 2013-09-23 MED ORDER — AMOXICILLIN 500 MG PO CAPS: 1000.0000 mg | ORAL_CAPSULE | Freq: Three times a day (TID) | ORAL | Status: DC

## 2013-09-23 MED ORDER — GUAIFENESIN-CODEINE 100-10 MG/5ML PO SYRP: 10.0000 mL | ORAL_SOLUTION | Freq: Four times a day (QID) | ORAL | Status: DC | PRN

## 2013-09-23 MED ORDER — PREDNISONE 20 MG PO TABS: 20.0000 mg | ORAL_TABLET | Freq: Two times a day (BID) | ORAL | Status: DC

## 2013-09-23 NOTE — ED Provider Notes (Signed)
Chief Complaint   Chief Complaint  Patient presents with  . Cough    History of Present Illness   Catherine Austin is a 43 year old female who has had a 2-1/2 week history of cough productive yellow sputum, wheezing, headache, sore throat, nasal congestion. She denies chest pain, fever, earache, or GI symptoms. No sick exposures.  Review of Systems   Other than as noted above, the patient denies any of the following symptoms: Systemic:  No fevers, chills, sweats, or myalgias. Eye:  No redness or discharge. ENT:  No ear pain, headache, nasal congestion, drainage, sinus pressure, or sore throat. Neck:  No neck pain, stiffness, or swollen glands. Lungs:  No cough, sputum production, hemoptysis, wheezing, chest tightness, shortness of breath or chest pain. GI:  No abdominal pain, nausea, vomiting or diarrhea.  PMFSH   Past medical history, family history, social history, meds, and allergies were reviewed.   Physical exam   Vital signs:  BP 112/52  Pulse 73  Temp(Src) 98.1 F (36.7 C) (Oral)  Resp 12  SpO2 96%  LMP 09/04/2013 General:  Alert and oriented.  In no distress.  Skin warm and dry. Eye:  No conjunctival injection or drainage. Lids were normal. ENT:  TMs and canals were normal, without erythema or inflammation.  Nasal mucosa was clear and uncongested, without drainage.  Mucous membranes were moist.  Pharynx was clear with no exudate or drainage.  There were no oral ulcerations or lesions. Neck:  Supple, no adenopathy, tenderness or mass. Lungs:  No respiratory distress.  Lungs were clear to auscultation, without wheezes, rales or rhonchi.  Breath sounds were clear and equal bilaterally.  Heart:  Regular rhythm, without gallops, murmers or rubs. Skin:  Clear, warm, and dry, without rash or lesions.  Labs   Results for orders placed in visit on 03/26/13  POCT RAPID STREP A (OFFICE)      Result Value Ref Range   Rapid Strep A Screen Negative  Negative     Radiology    Dg Chest 2 View  09/23/2013   CLINICAL DATA:  Chest pain and cough and shortness of breath for 2 weeks  EXAM: CHEST  2 VIEW  COMPARISON:  Portable chest x-ray of April 08, 2007.  FINDINGS: The lungs are adequately inflated and clear. The heart and mediastinal structures are within the limits of normal. There is no pleural effusion or pneumothorax. The bony thorax is unremarkable.  IMPRESSION: There is no acute cardiopulmonary disease.   Electronically Signed   By: Wendee Hata  [place]   On: 09/23/2013 16:50   Assessment     The encounter diagnosis was Acute bronchitis.  Plan    1.  Meds:  The following meds were prescribed:   Discharge Medication List as of 09/23/2013  5:16 PM    START taking these medications   Details  amoxicillin (AMOXIL) 500 MG capsule Take 2 capsules (1,000 mg total) by mouth 3 (three) times daily., Starting 09/23/2013, Until Discontinued, Normal    guaiFENesin-codeine (GUIATUSS AC) 100-10 MG/5ML syrup Take 10 mLs by mouth 4 (four) times daily as needed for cough., Starting 09/23/2013, Until Discontinued, Print    predniSONE (DELTASONE) 20 MG tablet Take 1 tablet (20 mg total) by mouth 2 (two) times daily., Starting 09/23/2013, Until Discontinued, Normal        2.  Patient Education/Counseling:  The patient was given appropriate handouts, self care instructions, and instructed in symptomatic relief.  Instructed to get extra fluids, rest, and use a  cool mist vaporizer.    3.  Follow up:  The patient was told to follow up here if no better in 3 to 4 days, or sooner if becoming worse in any way, and given some red flag symptoms such as increasing fever, difficulty breathing, chest pain, or persistent vomiting which would prompt immediate return.  Follow up here as needed.      Reuben Likes, MD 09/23/13 [phone]

## 2013-09-23 NOTE — ED Notes (Signed)
C/o cough See physician note

## 2013-09-23 NOTE — Discharge Instructions (Signed)
Bronquitis aguda  ( Acute Bronchitis)  La bronquitis es una inflamación de las vías respiratorias que se extienden desde la tráquea hasta los pulmones (bronquios). La inflamación produce la formación de mucosidad. Esto produce tos, que es el síntoma más frecuente de la bronquitis.   Cuando la bronquitis es aguda, generalmente comienza de manera súbita y desaparece luego de un par de semanas. El hábito de fumar, las alergias y el asma pueden empeorar la bronquitis. Los episodios repetidos de bronquitis pueden causar más problemas pulmonares.   CAUSAS  La causa más frecuente de bronquitis aguda es el mismo virus que produce el resfrío. El virus se propaga de persona a persona (contagioso).   SIGNOS Y SÍNTOMAS   · Tos.    · Fiebre.    · Tos con mucosidad.    · Dolores en el cuerpo.    · Congestión en el pecho.    · Escalofríos.    · Falta de aire.    · Dolor de garganta.    DIAGNÓSTICO   La bronquitis aguda en general se diagnostica con un examen físico. En algunos casos se indican otros estudios, como radiografías, para descartar otras enfermedades.   TRATAMIENTO   La bronquitis aguda generalmente desaparece en un par de semanas. Con frecuencia no es necesario realizar un tratamiento. Los medicamentos se indican para aliviar la fiebre o la tos. Generalmente no es necesario el uso de antibióticos, pero pueden indicarse en ciertas ocasiones. En algunos casos, se recomienda el uso de un inhalador para mejorar la falta de aire y controlar la tos. Un vaporizador de aire frío podrá ayudarlo a disolver las secreciones bronquiales y facilitar su eliminación.   INSTRUCCIONES PARA EL CUIDADO EN EL HOGAR  · Descanse lo suficiente.    · Beba líquidos en abundancia para mantener la orina de color claro o amarillo pálido (excepto que padezca una enfermedad que requiera la restricción de líquidos). Tome mucho líquido para disolver las secreciones y evitar la deshidratación.    · Tome sólo medicamentos de venta libre o recetados,  según las indicaciones del médico.    · Evite fumar o aspirar el humo de otros fumadores. La exposición al humo del cigarrillo o a irritantes químicos hará que la bronquitis empeore. Si fuma, considere el uso de goma de mascar o la aplicación de parches en la piel que contengan nicotina para aliviar los síntomas de abstinencia. Si deja de fumar, sus pulmones se curarán más rápido.    · Reduzca la probabilidad de otro brote de bronquitis aguda lavando sus manos con frecuencia, evitando a las personas que tengan síntomas y tratando de no tocarse las manos con la boca, la nariz o los ojos.    · Concurra a las consultas de control con su médico según las indicaciones.    SOLICITE ATENCIÓN MÉDICA SI:  Los síntomas no mejoran después de 1 semana de tratamiento.   SOLICITE ATENCIÓN MÉDICA DE INMEDIATO SI:  · Comienza a tener fiebre o escalofríos cada vez más intensos.    · Siente dolor en el pecho.    · Le falta el aire de manera preocupante.  · La flema tiene sangre.    · Se deshidrata.  · Se desmaya.  · Tiene vómitos que se repiten.  · Tiene un dolor de cabeza intenso.  ASEGÚRESE DE QUE:   · Comprende estas instrucciones.  · Controlará su afección.  · Recibirá ayuda de inmediato si no mejora o si empeora.  Document Released: 04/04/2005 Document Revised: 12/05/2012  [employer]® Patient Information ©2014 [employer].

## 2014-05-02 ENCOUNTER — Ambulatory Visit: Payer: Self-pay | Attending: Internal Medicine

## 2014-08-10 ENCOUNTER — Encounter (HOSPITAL_COMMUNITY): Payer: Self-pay | Admitting: Nurse Practitioner

## 2014-08-10 ENCOUNTER — Emergency Department (HOSPITAL_COMMUNITY)
Admission: EM | Admit: 2014-08-10 | Discharge: 2014-08-10 | Discharge status: Against Medical Advice | Payer: Self-pay | Attending: Emergency Medicine | Admitting: Emergency Medicine

## 2014-08-10 DIAGNOSIS — N939 Abnormal uterine and vaginal bleeding, unspecified: Secondary | ICD-10-CM | POA: Insufficient documentation

## 2014-08-10 LAB — URINALYSIS, ROUTINE W REFLEX MICROSCOPIC
Bilirubin Urine: NEGATIVE
Glucose, UA: NEGATIVE mg/dL
Ketones, ur: NEGATIVE mg/dL
Nitrite: NEGATIVE
Protein, ur: NEGATIVE mg/dL
Specific Gravity, Urine: 1.016 (ref 1.005–1.030)
Urobilinogen, UA: 0.2 mg/dL (ref 0.0–1.0)
pH: 7.5 (ref 5.0–8.0)

## 2014-08-10 LAB — URINE MICROSCOPIC-ADD ON

## 2014-08-10 NOTE — ED Notes (Signed)
Pt reports she started her period yesterday and its been heavier than normal and shes having severe pelvic cramps. She took tylenol with some relief. She reports changing her pad every 4 hours.

## 2014-08-10 NOTE — ED Notes (Addendum)
Pt states she is tired of waiting and is going to see her doctor tomorrow.  Encouraged pt to stay and wait to be seen.  Pt states she is not patient and doesn't want to wait.  Pt left with 3 visitors.

## 2015-07-21 ENCOUNTER — Ambulatory Visit (INDEPENDENT_AMBULATORY_CARE_PROVIDER_SITE_OTHER): Payer: Self-pay | Admitting: Family Medicine

## 2015-07-21 VITALS — BP 119/63 | HR 85 | Temp 97.9°F | Ht 62.0 in | Wt 217.5 lb

## 2015-07-21 DIAGNOSIS — R87613 High grade squamous intraepithelial lesion on cytologic smear of cervix (HGSIL): Secondary | ICD-10-CM

## 2015-07-21 DIAGNOSIS — R319 Hematuria, unspecified: Secondary | ICD-10-CM

## 2015-07-21 DIAGNOSIS — Z3200 Encounter for pregnancy test, result unknown: Secondary | ICD-10-CM

## 2015-07-21 LAB — POCT URINALYSIS DIPSTICK
BILIRUBIN UA: NEGATIVE
Blood, UA: NEGATIVE
Glucose, UA: NEGATIVE
Ketones, UA: NEGATIVE
LEUKOCYTES UA: NEGATIVE
Nitrite, UA: NEGATIVE
PH UA: 6.5
PROTEIN UA: NEGATIVE
Spec Grav, UA: 1.015
Urobilinogen, UA: 0.2

## 2015-07-21 LAB — POCT URINE PREGNANCY: Preg Test, Ur: NEGATIVE

## 2015-07-21 NOTE — Patient Instructions (Signed)
Thank you for coming in,   I will call or send a letter with the results from today.   Please follow up with me to have an annual physical and a pap smear completed.   Please bring all of your medications with you to each visit.   Sign up for My Chart to have easy access to your labs results, and communication with your Primary care physician   Please feel free to call with any questions or concerns at any time, at [phone]. --Dr. Jordan LikesSchmitz

## 2015-07-21 NOTE — Progress Notes (Signed)
   Subjective:    Catherine Austin - 45 y.o. female MRN [id number]  Date of birth: Sep 09, 1970  CC possible pregnancy   HPI  Catherine Austin is here for possible pregnancy. Video interpreter in Spanish was used for this visit.  She hasn't had a cycle in the past three months.  She has been sexually active.  She has a boyfriend.  Has had one partner in the past 6 months.  Unsure of when her mother went through menopause.   Hematuria Has been documented on different urinalysis  Denies noticing any blood in her urine.  No history of kidney stones and no tobacco  CT ab/pelvis (2008): normal    PMH: high grade squamous intraepi SH: denies any tobacco abuse   Health Maintenance:  There are no preventive care reminders to display for this patient.  Review of Systems See HPI     Objective:   Physical Exam BP 119/63 mmHg  Pulse 85  Temp(Src) 97.9 F (36.6 C) (Oral)  Ht 5\' 2"  (1.575 m)  Wt 217 lb 8 oz (98.657 kg)  BMI 39.77 kg/m2  LMP  Gen: NAD, alert, cooperative with exam, Obese CV: RRR, good S1/S2, no murmur, no edema,  Resp: CTABL, no wheezes, non-labored Abd: SNTND, BS present, no guarding or organomegaly Skin: no rashes, normal turgor  Neuro: no gross deficits.     Assessment & Plan:   PAP SMER CERV W/HI GRADE SQUAMOUS INTRAEPITH LES Upon chart review looks like she had a normal Pap smear in 2014. - Encourage her to follow-up for repeat Pap.  Possible pregnancy Pregnancy test negative - Counseled that if she is trying to get pregnant and she needs to start using prenatal vitamins  Hematuria She has had large blood in her urine on previous urine studies She denies any history of tobacco use or kidney stones - Urinalysis and urine culture today

## 2015-07-22 LAB — URINE CULTURE
COLONY COUNT: NO GROWTH
Organism ID, Bacteria: NO GROWTH

## 2015-07-23 DIAGNOSIS — R319 Hematuria, unspecified: Secondary | ICD-10-CM | POA: Insufficient documentation

## 2015-07-23 DIAGNOSIS — Z3200 Encounter for pregnancy test, result unknown: Secondary | ICD-10-CM | POA: Insufficient documentation

## 2015-07-23 NOTE — Assessment & Plan Note (Signed)
Upon chart review looks like she had a normal Pap smear in 2014. - Encourage her to follow-up for repeat Pap.

## 2015-07-23 NOTE — Assessment & Plan Note (Signed)
Pregnancy test negative - Counseled that if she is trying to get pregnant and she needs to start using prenatal vitamins

## 2015-07-23 NOTE — Assessment & Plan Note (Signed)
She has had large blood in her urine on previous urine studies She denies any history of tobacco use or kidney stones - Urinalysis and urine culture today

## 2015-07-30 ENCOUNTER — Ambulatory Visit: Payer: Self-pay

## 2015-08-04 ENCOUNTER — Ambulatory Visit (INDEPENDENT_AMBULATORY_CARE_PROVIDER_SITE_OTHER): Payer: Self-pay | Admitting: Family Medicine

## 2015-08-04 ENCOUNTER — Other Ambulatory Visit (HOSPITAL_COMMUNITY)
Admission: RE | Admit: 2015-08-04 | Discharge: 2015-08-04 | Disposition: A | Discharge status: Home / Self Care | Payer: Self-pay | Source: Ambulatory Visit | Attending: Family Medicine | Admitting: Family Medicine

## 2015-08-04 ENCOUNTER — Encounter: Payer: Self-pay | Admitting: Family Medicine

## 2015-08-04 VITALS — BP 140/80 | HR 62 | Temp 98.3°F | Ht 62.0 in | Wt 218.0 lb

## 2015-08-04 DIAGNOSIS — D649 Anemia, unspecified: Secondary | ICD-10-CM

## 2015-08-04 DIAGNOSIS — Z124 Encounter for screening for malignant neoplasm of cervix: Secondary | ICD-10-CM

## 2015-08-04 DIAGNOSIS — Z01419 Encounter for gynecological examination (general) (routine) without abnormal findings: Secondary | ICD-10-CM | POA: Insufficient documentation

## 2015-08-04 DIAGNOSIS — Z20828 Contact with and (suspected) exposure to other viral communicable diseases: Secondary | ICD-10-CM

## 2015-08-04 DIAGNOSIS — Z0000 Encounter for general adult medical examination without abnormal findings: Secondary | ICD-10-CM

## 2015-08-04 DIAGNOSIS — Z1151 Encounter for screening for human papillomavirus (HPV): Secondary | ICD-10-CM | POA: Insufficient documentation

## 2015-08-04 DIAGNOSIS — Z202 Contact with and (suspected) exposure to infections with a predominantly sexual mode of transmission: Secondary | ICD-10-CM

## 2015-08-04 DIAGNOSIS — Z113 Encounter for screening for infections with a predominantly sexual mode of transmission: Secondary | ICD-10-CM | POA: Insufficient documentation

## 2015-08-04 DIAGNOSIS — E669 Obesity, unspecified: Secondary | ICD-10-CM

## 2015-08-04 LAB — POCT HEMOGLOBIN: HEMOGLOBIN: 12.2 g/dL (ref 12.2–16.2)

## 2015-08-04 LAB — POCT WET PREP (WET MOUNT): Clue Cells Wet Prep Whiff POC: NEGATIVE

## 2015-08-04 LAB — LIPID PANEL
Cholesterol: 137 mg/dL (ref 125–200)
HDL: 38 mg/dL — AB (ref >=46)
LDL CALC: 76 mg/dL (ref <130)
Total CHOL/HDL Ratio: 3.6 Ratio (ref <=5.0)
Triglycerides: 113 mg/dL (ref <150)
VLDL: 23 mg/dL (ref <30)

## 2015-08-04 NOTE — Patient Instructions (Addendum)
Thank you for coming in,   I will call or send a letter with the results from today.   Please bring all of your medications with you to each visit.   Sign up for My Chart to have easy access to your labs results, and communication with your Primary care physician   Please feel free to call with any questions or concerns at any time, at [phone]. --Dr. Jordan LikesSchmitz  Diet Recommendations  Starchy (carb) foods include: Bread, rice, pasta, potatoes, corn, crackers, bagels, muffins, all baked goods.   Protein foods include: Meat, fish, poultry, eggs, dairy foods, and beans such as pinto and kidney beans (beans also provide carbohydrate).   1. Eat at least 3 meals and 1-2 snacks per day. Never go more than 4-5 hours while awake without eating.  2. Limit starchy foods to TWO per meal and ONE per snack. ONE portion of a starchy  food is equal to the following:   - ONE slice of bread (or its equivalent, such as half of a hamburger bun).   - 1/2 cup of a "scoopable" starchy food such as potatoes or rice.   - 1 OUNCE (28 grams) of starchy snack foods such as crackers or pretzels (look on label).   - 15 grams of carbohydrate as shown on food label.  3. Both lunch and dinner should include a protein food, a carb food, and vegetables.   - Obtain twice as many veg's as protein or carbohydrate foods for both lunch and dinner.   - Try to keep frozen veg's on hand for a quick vegetable serving.     - Fresh or frozen veg's are best.  4. Breakfast should always include protein.

## 2015-08-04 NOTE — Progress Notes (Signed)
   Subjective:    Catherine Austin - 45 y.o. female MRN [id number]  Date of birth: 20-Oct-1970  CC annual exam   HPI  Catherine Austin is here for annual exam .  Cardiovascular: - Dx Hypertension: no  - Dx Hyperlipidemia: no  - Dx Obesity: yes  - Physical Activity: yes, reports she is going to the gym   - Diabetes: no  Cancer: Colorectal >> no Lung >> Tobacco Use: no Breast >> Mammogram: no  Cervical/Endometrial >>  - Postmenopausal: no  - reports she hasn't had her cycle in the past three months.  - Pap Smear: yes   - Previous Abnormal Pap: history of high grade sq intraepithilial lesion. Normal pap in 2014  Skin >> Suspicious lesions: no   Social: Alcohol Use: no  Tobacco Use: no   - Interested in Quitting: no  Other Drugs: no  Risky Sexual Behavior: yes  Support and Life at Home: she works by cleaning houses .   Health Maintenance: up to date  Review of Systems See HPI     Objective:   Physical Exam BP 140/80 mmHg  Pulse 62  Temp(Src) 98.3 F (36.8 C) (Oral)  Ht 5\' 2"  (1.575 m)  Wt 218 lb (98.884 kg)  BMI 39.86 kg/m2  SpO2 98%  LMP 04/19/2015 Gen: NAD, alert, cooperative with exam,  HEENT: NCAT, clear conjunctiva,  supple neck CV: RRR, good S1/S2, no murmur, no edema,   Resp: CTABL, no wheezes, non-labored Abd: SNTND, BS present, no guarding or organomegaly Skin: no rashes, normal turgor  Neuro: no gross deficits.  Psych: good insight, alert and oriented GU: External: no lesions Vagina: no blood in vault Cervix: no lesion; no mucopurulent d/c; no motion tenderness Uterus: small, mobile Adnexa: no masses; non tender   Assessment & Plan:   Annual physical exam Pap completed today  Encouraged to exercise and diet  Encouraged safe sex practices   Obesity, unspecified Encouraged diet and exercise Discussed about a nutritionist and she may want to pursue that in the future.

## 2015-08-05 DIAGNOSIS — Z0000 Encounter for general adult medical examination without abnormal findings: Secondary | ICD-10-CM | POA: Insufficient documentation

## 2015-08-05 LAB — GC/CHLAMYDIA PROBE AMP (~~LOC~~) NOT AT ARMC
CHLAMYDIA, DNA PROBE: NEGATIVE
Neisseria Gonorrhea: NEGATIVE

## 2015-08-05 LAB — RPR

## 2015-08-05 LAB — HIV ANTIBODY (ROUTINE TESTING W REFLEX): HIV: NONREACTIVE

## 2015-08-05 NOTE — Assessment & Plan Note (Signed)
Pap completed today  Encouraged to exercise and diet  Encouraged safe sex practices

## 2015-08-05 NOTE — Assessment & Plan Note (Signed)
Encouraged diet and exercise Discussed about a nutritionist and she may want to pursue that in the future.

## 2015-08-06 ENCOUNTER — Encounter: Payer: Self-pay | Admitting: Family Medicine

## 2015-08-06 LAB — CYTOLOGY - PAP

## 2015-08-18 ENCOUNTER — Telehealth: Payer: Self-pay | Admitting: Family Medicine

## 2015-08-18 NOTE — Telephone Encounter (Signed)
Discussed pap smear results and informed of transformation zone was not seen but negative HPV. Advised to follow up in 2-4 months for repeat pap.   Myra RudeJeremy E Aydden Cumpian, MD PGY-3, [facility] 08/18/2015, 1:35 PM

## 2015-11-16 ENCOUNTER — Emergency Department (HOSPITAL_COMMUNITY)
Admission: EM | Admit: 2015-11-16 | Discharge: 2015-11-16 | Disposition: A | Discharge status: Home / Self Care | Payer: No Typology Code available for payment source | Attending: Emergency Medicine | Admitting: Emergency Medicine

## 2015-11-16 ENCOUNTER — Encounter (HOSPITAL_COMMUNITY): Payer: Self-pay | Admitting: Emergency Medicine

## 2015-11-16 ENCOUNTER — Emergency Department (HOSPITAL_COMMUNITY): Payer: No Typology Code available for payment source

## 2015-11-16 DIAGNOSIS — Y92410 Unspecified street and highway as the place of occurrence of the external cause: Secondary | ICD-10-CM | POA: Diagnosis not present

## 2015-11-16 DIAGNOSIS — M79605 Pain in left leg: Secondary | ICD-10-CM | POA: Insufficient documentation

## 2015-11-16 DIAGNOSIS — Y999 Unspecified external cause status: Secondary | ICD-10-CM | POA: Insufficient documentation

## 2015-11-16 DIAGNOSIS — Y9389 Activity, other specified: Secondary | ICD-10-CM | POA: Diagnosis not present

## 2015-11-16 MED ORDER — IBUPROFEN 800 MG PO TABS: 800.0000 mg | ORAL_TABLET | Freq: Three times a day (TID) | ORAL | 0 refills | Status: DC
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
## 2015-11-16 NOTE — Discharge Instructions (Signed)

## 2015-11-18 ENCOUNTER — Encounter: Payer: Self-pay | Admitting: Family Medicine

## 2015-12-08 ENCOUNTER — Encounter: Payer: Self-pay | Admitting: Pediatric Intensive Care

## 2015-12-08 DIAGNOSIS — Z139 Encounter for screening, unspecified: Secondary | ICD-10-CM

## 2015-12-08 LAB — GLUCOSE, POCT (MANUAL RESULT ENTRY): POC GLUCOSE: 87 mg/dL (ref 70–99)
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
## 2016-01-21 ENCOUNTER — Emergency Department (HOSPITAL_COMMUNITY): Payer: Self-pay

## 2016-01-21 ENCOUNTER — Emergency Department (HOSPITAL_COMMUNITY)
Admission: EM | Admit: 2016-01-21 | Discharge: 2016-01-22 | Disposition: A | Discharge status: Home / Self Care | Payer: Self-pay | Attending: Emergency Medicine | Admitting: Emergency Medicine

## 2016-01-21 ENCOUNTER — Encounter (HOSPITAL_COMMUNITY): Payer: Self-pay

## 2016-01-21 DIAGNOSIS — Y999 Unspecified external cause status: Secondary | ICD-10-CM | POA: Insufficient documentation

## 2016-01-21 DIAGNOSIS — Y939 Activity, unspecified: Secondary | ICD-10-CM | POA: Insufficient documentation

## 2016-01-21 DIAGNOSIS — S161XXA Strain of muscle, fascia and tendon at neck level, initial encounter: Secondary | ICD-10-CM | POA: Insufficient documentation

## 2016-01-21 DIAGNOSIS — M545 Low back pain: Secondary | ICD-10-CM | POA: Insufficient documentation

## 2016-01-21 DIAGNOSIS — S0990XA Unspecified injury of head, initial encounter: Secondary | ICD-10-CM | POA: Insufficient documentation

## 2016-01-21 DIAGNOSIS — Y92410 Unspecified street and highway as the place of occurrence of the external cause: Secondary | ICD-10-CM | POA: Insufficient documentation

## 2016-01-21 NOTE — ED Notes (Signed)
See MD assessment. 
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
## 2016-01-22 MED ORDER — IBUPROFEN 800 MG PO TABS: 800.0000 mg | ORAL_TABLET | Freq: Three times a day (TID) | ORAL | 0 refills | Status: DC

## 2016-01-22 NOTE — Discharge Instructions (Signed)
Your testing is negative for serious injury. Follow up with your doctor. Return to the ED if you develop new or worsening symptoms. °

## 2016-04-07 ENCOUNTER — Ambulatory Visit: Payer: Self-pay | Attending: Internal Medicine

## 2016-04-07 ENCOUNTER — Ambulatory Visit (INDEPENDENT_AMBULATORY_CARE_PROVIDER_SITE_OTHER): Payer: Self-pay | Admitting: Family Medicine

## 2016-04-07 VITALS — BP 118/70 | HR 85 | Temp 97.7°F | Ht 64.0 in | Wt 229.8 lb

## 2016-04-07 DIAGNOSIS — R059 Cough, unspecified: Secondary | ICD-10-CM

## 2016-04-07 DIAGNOSIS — R0982 Postnasal drip: Secondary | ICD-10-CM

## 2016-04-07 DIAGNOSIS — R05 Cough: Secondary | ICD-10-CM

## 2016-04-07 MED ORDER — BENZONATATE 100 MG PO CAPS: 100.0000 mg | ORAL_CAPSULE | Freq: Two times a day (BID) | ORAL | 1 refills | Status: DC | PRN

## 2016-04-07 NOTE — Progress Notes (Signed)
   SUBJECTIVE:  Catherine Austin is a 45 y.o. female who complains of headache, nasal congestion, nasal discharge, productive cough with yellow sputum and cough described as harsh for 2 weeks. She denies a history of chest pain, dizziness, fevers, shortness of breath, vomiting, weakness and wheezing and denies a history of asthma. Patient denies smoke cigarettes.   OBJECTIVE: Today's Vitals   04/07/16 1441  BP: 118/70  Pulse: 85  Temp: 97.7 F (36.5 C)  TempSrc: Oral  SpO2: 95%  Weight: 229 lb 12.8 oz (104.2 kg)  Height: 5\' 4"  (1.626 m)  PainSc: 5   Physical Exam  Constitutional: She is oriented to person, place, and time. She appears well-developed and well-nourished.  HENT:  Head: Normocephalic.  Right Ear: External ear normal.  Left Ear: External ear normal.  Mouth/Throat: Oropharynx is clear and moist. No oropharyngeal exudate.  Eyes: Pupils are equal, round, and reactive to light. Right eye exhibits no discharge. Left eye exhibits no discharge. No scleral icterus.  Neck: Normal range of motion. Neck supple.  Cardiovascular: Normal rate, normal heart sounds and intact distal pulses.   Respiratory: Effort normal. No respiratory distress. She has no wheezes. She has no rales.  GI: Soft. Bowel sounds are normal.  Musculoskeletal: Normal range of motion. She exhibits no edema.  Lymphadenopathy:    She has no cervical adenopathy.  Neurological: She is alert and oriented to person, place, and time.  Skin: Skin is warm and dry. No rash noted.  Psychiatric: She has a normal mood and affect. Her behavior is normal. Judgment and thought content normal.    ASSESSMENT:  Post nasal drip causing cough. Patient afebrile with normal vital signs and normal physical exam. Normal pulmonary exam, no signs of pneumonia or bronchitis.   PLAN: Symptomatic therapy suggested: push fluids, rest and return office visit prn if symptoms persist or worsen. Prescription given for Tessalon perles for cough.  If cough persists for >3 weeks consider chest xray or treatment for possible GERD.

## 2016-04-07 NOTE — Patient Instructions (Signed)
Please take Ibuprofen as needed for headache. Take tessalon for cough. If you do not feel better within a week, please come back.    Infeccin del tracto respiratorio superior, adultos (Upper Respiratory Infection, Adult) La mayora de las infecciones del tracto respiratorio superior son infecciones virales de las vas que llevan el aire a los pulmones. Un infeccin del tracto respiratorio superior afecta la nariz, la garganta y las vas respiratorias superiores. El tipo ms frecuente de infeccin del tracto respiratorio superior es la nasofaringitis, que habitualmente se conoce como "resfro comn". Las infecciones del tracto respiratorio superior siguen su curso y por lo general se curan solas. En la International Business Machinesmayora de los casos, la infeccin del tracto respiratorio superior no requiere atencin Hemingwaymdica, Biomedical engineerpero a veces, despus de una infeccin viral, puede surgir una infeccin bacteriana en las vas respiratorias superiores. Esto se conoce como infeccin secundaria. Las infecciones sinusales y en el odo medio son tipos frecuentes de infecciones secundarias en el tracto respiratorio superior. La neumona bacteriana tambin puede complicar un cuadro de infeccin del tracto respiratorio superior. Este tipo de infeccin puede empeorar el asma y la enfermedad pulmonar obstructiva crnica (EPOC). En algunos casos, estas complicaciones pueden requerir atencin mdica de emergencia y poner en peligro la vida. CAUSAS Casi todas las infecciones del tracto respiratorio superior se deben a los virus. Un virus es un tipo de microbio que puede contagiarse de Neomia Dearuna persona a Educational psychologistotra. FACTORES DE RIESGO Puede estar en riesgo de sufrir una infeccin del tracto respiratorio superior si:  Fuma.  Tiene una enfermedad pulmonar o cardaca crnica.  Tiene debilitado el sistema de defensa (inmunitario) del cuerpo.  Es [address] [place] En[address]ancemuy joven o de edad muy Gardnersavanzada.  Tiene asma o alergias nasales.  Trabaja en reas donde hay mucha gente o poca  ventilacin.  Rudi Cocorabaja en una escuela o en un centro de atencin mdica. SIGNOS Y SNTOMAS Habitualmente, los sntomas aparecen de 2a 3das despus de entrar en contacto con el virus del resfro. La mayora de las infecciones virales en el tracto respiratorio superior duran de 7a 10das. Sin embargo, las infecciones virales en el tracto respiratorio superior a causa del virus de la gripe pueden durar de 14a 18das y, habitualmente, son ms graves. Entre los sntomas se pueden incluir los siguientes:  Secrecin o congestin nasal.  Estornudos.  Tos.  Dolor de Advertising copywritergarganta.  Dolor de Turkmenistancabeza.  Fatiga.  Grant RutsFiebre.  Prdida del apetito.  Dolor en la frente, detrs de los ojos y por encima de los pmulos (dolor sinusal).  Dolores musculares. DIAGNSTICO El mdico puede diagnosticar una infeccin del tracto respiratorio superior mediante los siguientes estudios:  Examen fsico.  Pruebas para verificar si los sntomas no se deben a otra afeccin, por ejemplo:  Faringitis estreptoccica.  Sinusitis.  Neumona.  Asma. TRATAMIENTO Esta infeccin desaparece sola, con el tiempo. No puede curarse con medicamentos, pero a menudo se prescriben para aliviar los sntomas. Los medicamentos pueden ser tiles para lo siguiente:  Personal assistantBajar la fiebre.  Reducir la tos.  Aliviar la congestin nasal. INSTRUCCIONES PARA EL CUIDADO EN EL HOGAR  Tome los medicamentos solamente como se lo haya indicado el mdico.  A fin de Engineer, materialsaliviar el dolor de garganta, haga grgaras con solucin salina templada o consuma caramelos para la tos, como se lo haya indicado el mdico.  Use un humidificador de vapor clido o inhale el vapor de la ducha para aumentar la humedad del aire. Esto facilitar la respiracin.  Beba suficiente lquido para mantener la orina clara o  de color amarillo plido.  Consuma sopas y otros caldos transparentes, y Abbott Laboratoriesalimntese bien.  Descanse todo lo que sea necesario.  Regrese al  Aleen Campitrabajo cuando la temperatura se le haya normalizado o cuando el mdico lo autorice. Es posible que deba quedarse en su casa durante un tiempo prolongado, para no infectar a los dems. Tambin puede usar un barbijo y lavarse las manos con cuidado para Transport plannerevitar la propagacin del virus.  Aumente el uso del inhalador si tiene asma.  No consuma ningn producto que contenga tabaco, lo que incluye cigarrillos, tabaco de Theatre managermascar o Administrator, Civil Servicecigarrillos electrnicos. Si necesita ayuda para dejar de fumar, consulte al American Expressmdico. PREVENCIN La mejor manera de protegerse de un resfro es mantener una higiene Biggsvilleadecuada.  Evite el contacto oral o fsico con personas que tengan sntomas de resfro.  En caso de contacto, lvese las manos con frecuencia. No hay pruebas claras de que la vitaminaC, la vitaminaE, la equincea o el ejercicio reduzcan la probabilidad de Primary school teachercontraer un resfro. Sin embargo, siempre se recomienda Insurance account managerdescansar mucho, hacer ejercicio y Engineering geologistalimentarse bien. SOLICITE ATENCIN MDICA SI:  Su estado empeora en lugar de mejorar.  Los medicamentos no Estate agentlogran controlar los sntomas.  Tiene escalofros.  La sensacin de falta de aire empeora.  Tiene mucosidad marrn o roja.  Tiene secrecin nasal amarilla o marrn.  Le duele la cara, especialmente al inclinarse hacia adelante.  Tiene fiebre.  Tiene los ganglios del cuello hinchados.  Siente dolor al tragar.  Tiene zonas blancas en la parte de atrs de la garganta. SOLICITE ATENCIN MDICA DE INMEDIATO SI:  Tiene sntomas intensos o persistentes de:  Dolor de Turkmenistancabeza.  Dolor de odos.  Dolor sinusal.  Dolor en el pecho.  Tiene enfermedad pulmonar crnica y cualquiera de estos sntomas:  Sibilancias.  Tos prolongada.  Tos con sangre.  Cambio en la mucosidad habitual.  Presenta rigidez en el cuello.  Tiene cambios en:  La visin.  La audicin.  El pensamiento.  El Clark Colonyestado de nimo. ASEGRESE DE QUE:  Comprende estas  instrucciones.  Controlar su afeccin.  Recibir ayuda de inmediato si no mejora o si empeora. Esta informacin no tiene Theme park managercomo fin reemplazar el consejo del mdico. Asegrese de hacerle al mdico cualquier pregunta que tenga. Document Released: 01/12/2005 Document Revised: 08/19/2014 Document Reviewed: 07/10/2013 Elsevier Interactive Patient Education  2017 [employer]Elsevier Inc.

## 2016-05-09 ENCOUNTER — Ambulatory Visit (INDEPENDENT_AMBULATORY_CARE_PROVIDER_SITE_OTHER): Payer: Self-pay | Admitting: Family Medicine

## 2016-05-09 VITALS — BP 111/82 | HR 85 | Temp 98.3°F | Ht 64.0 in | Wt 227.2 lb

## 2016-05-09 DIAGNOSIS — Z23 Encounter for immunization: Secondary | ICD-10-CM

## 2016-05-09 DIAGNOSIS — E669 Obesity, unspecified: Secondary | ICD-10-CM

## 2016-05-09 DIAGNOSIS — Z0000 Encounter for general adult medical examination without abnormal findings: Secondary | ICD-10-CM

## 2016-05-09 DIAGNOSIS — R739 Hyperglycemia, unspecified: Secondary | ICD-10-CM

## 2016-05-09 LAB — POCT GLYCOSYLATED HEMOGLOBIN (HGB A1C): Hemoglobin A1C: 5.3

## 2016-05-09 NOTE — Patient Instructions (Addendum)
Fue Optometrist. Consulte a continuacin para revisar nuestro plan para la visita de hoy.  1. Hoy fuiste a un examen fsico. En general, no encontr nada que fuera preocupante. Ahora est actualizado en su examen fsico anual. 2. He revisado tu nivel de A1c para detectar diabetes. Lo har cuando note los resultados de [address] 3. Te animo a que contines haciendo ejercicio y evites comer alimentos ricos en azcares y grasas. Por favor, evite comer demasiada fruta ya que tiene [place] y azcar que su cuerpo puede absorber. Lo mejor es tener verduras frescas a diario y Multimedia programmer uso excesivo de [place] de sal al cocinar. Por favor, evite comer fuera con frecuencia debido a los altos niveles de sal en el. 4. Regrese a la clnica en 6 meses. Hablaremos sobre los [employer] de prdida de peso durante su prxima visita. Ver archivo adjunto a continuacin.  Llame a la clnica al [phone] si sus sntomas empeoran o si tiene alguna inquietud. Fue Actor. - Durward Parcel, DO [facility], PGY-1   Hacer ejercicio para bajar de peso (Exercising to [employer]) Hacer ejercicio puede ayudarlo a [employer]. Para bajar de [employer] ejercicio, este debe ser de intensidad vigorosa. Puede saber que est haciendo ejercicio de intensidad vigorosa si respira con mucha dificultad y rapidez, y no puede mantener una conversacin. El ejercicio de intensidad moderada ayuda a Radio producer peso actual. Puede saber que est haciendo ejercicio de intensidad moderada si tiene una frecuencia cardaca ms elevada y [place] respiracin ms rpida, pero an puede Diplomatic Services operational officer. CON QU FRECUENCIA DEBO HACER EJERCICIO? Elija una actividad que disfrute y establezca objetivos realistas. El mdico puede ayudarlo a Event organiser un plan de actividades que funcione para usted. Haga ejercicio regularmente como se lo haya indicado el mdico. Esta puede incluir:  Chief Executive Officer de resistencia dos veces por semana, como:  Flexiones de [place].  Abdominales.  Levantamiento de pesas.  Ejercicios con bandas elsticas.  Realizar una intensidad determinada de ejercicio durante una cantidad determinada de [place]. Elija entre estas opciones:  de ejercicio de intensidad moderada cada semana.  de ejercicio de intensidad vigorosa cada semana.  Burlene Arnt de ejercicio de intensidad moderada y vigorosa cada semana. Los nios, las mujeres [place], las personas que no estn en forma, las personas con sobrepeso y los adultos mayores tal vez tengan que consultar a un mdico para que les d Medical laboratory scientific officer. Si tiene [employer], asegrese de Science writer al mdico antes de comenzar un programa de ejercicios nuevo. CULES SON ALGUNAS ACTIVIDADES QUE PUEDEN AYUDARME A BAJAR DE PESO?  Caminar a un ritmo de al menos 4,73millas (7kilmetros) por hora.  Trotar o correr a un ritmo de 5 millas (8 kilmetros) por hora.  Andar en bicicleta a un ritmo de al menos 10 millas (16 kilmetros) por hora.  Practicar natacin.  Practicar patinaje sobre ruedas normales o en lnea.  Hacer esqu de fondo.  Hacer deportes competitivos vigorosos, como ftbol americano, bsquet y ftbol.  Saltar la soga.  Tomar clases de baile aerbico. CMO PUEDO SER MS ACTIVO EN MIS ACTIVIDADES DIARIAS?  Utilice las Microbiologist del ascensor.  D una caminata durante su hora de almuerzo.  Si conduce, estacione el automvil ms lejos del trabajo o de la escuela.  Si [place] transporte pblico, bjese una parada antes y camine el resto del camino.  Pngase de pie y camine cada vez que haga  llamadas telefnicas.  Levntese, estrese y camine cada 30minutos a lo largo del Futures traderda. QU PAUTAS DEBO SEGUIR MIENTRAS HAGO EJERCICIO?  No haga ejercicio en exceso que pudiera hacer que se lastime, se sienta mareado o tenga dificultad para  respirar.  Consulte al mdico antes de comenzar un programa de ejercicios nuevo.  Use ropa cmoda y calzado con buen soporte.  Beba gran cantidad de agua mientras hace ejercicios para evitar la deshidratacin o los golpes de Airline pilotcalor. Durante la actividad fsica se pierde agua corporal que se debe reponer.  Haga ejercicio hasta que se acelere su respiracin y sus latidos cardacos. Esta informacin no tiene Theme park managercomo fin reemplazar el consejo del mdico. Asegrese de hacerle al mdico cualquier pregunta que tenga. Document Released: 07/09/2010 Document Revised: 04/25/2014 Document Reviewed: 09/05/2013 Elsevier Interactive Patient Education  2017 Arvin[employer]itorElsevier Inc.

## 2016-05-09 NOTE — Progress Notes (Signed)
   Subjective:   Patient ID: Catherine Austin    DOB: 1971/03/11, 46 y.o. female   MRN: [id number]  CC: "annual physical"  HPI: Catherine Austin is a 46 y.o. female who presents to clinic today for annual physical. Problems discussed today are as follows:  Annual physical exam: Patient presenting for annual physical exam. No complaints or concerns today. Says she would like to loose weight. Asking about mammogram stating she never had one. Never smoker and denies EtOH use. Denies symptoms of CP, SOB, N/V/D.  Obesity: Patient interested in loosing weight. Says she walks sometimes but has not made attempts to exercise. No efforts to diet at this time. States she likes to eat fruit often. Denies h/o diabetes despite obesity.  ROS: complete ROS performed, see HPI for pertinent ROS.  PMFSH: Obesity. Smoking status reviewed. Medications reviewed.  Objective:   BP 111/82 (BP Location: Left Arm, Patient Position: Sitting, Cuff Size: Normal)   Pulse 85   Temp 98.3 F (36.8 C) (Oral)   Ht 5\' 4"  (1.626 m)   Wt 227 lb 3.2 oz (103.1 kg)   LMP 04/19/2015   SpO2 97%   BMI 39.00 kg/m  Vitals and nursing note reviewed.  General: well nourished, well developed, in no acute distress with non-toxic appearance HEENT: normocephalic, atraumatic, moist mucous membranes Neck: supple, non-tender without lymphadenopathy CV: regular rate and rhythm without murmurs, rubs, or gallops, no lower extremity edema Lungs: clear to auscultation bilaterally with normal work of breathing Abdomen: soft, non-tender, non-distended, no masses or organomegaly palpable, normoactive bowel sounds Skin: warm, dry, no rashes or lesions, cap refill < 2 seconds Extremities: warm and well perfused, normal tone  Assessment & Plan:   Obesity (BMI 35.0-39.9 without comorbidity) Chronic. Patient desires to loose weight. Minimal efforts made. Would benefit from nutrition education and exercise regimen. Never smoker. A1c 5.3 at  visit. --Consider referral to Dr. Gerilyn PilgrimSykes. --Help formulate plan for exercise. --RTC in 3 months for weight loss management.  Annual physical exam No concerns. Overall is doing well. Last PAP on 07/2015 neg. Never had mammogram, interested. Never smoker. A1c 5.3. Obesity seems to be primary issue. Patient seeking to loose weight. Denies symptoms of fevers, chills, fatigue, CP, SOB, N//V/D, HA, abdominal pain, polyuria, polydipsia, polyphagia. --A1c UTD. Recheck in 3 months. --Received flu shot. --Discussed weight loss. Diet and exercise recommendations given. --RTC in 3 months.  Orders Placed This Encounter  Procedures  . Flu Vaccine QUAD 36+ mos IM  . POCT glycosylated hemoglobin (Hb A1C)   No orders of the defined types were placed in this encounter.   Durward Parcelavid McMullen, DO [facility], PGY-1 05/10/2016 12:33 PM

## 2016-05-10 ENCOUNTER — Encounter: Payer: Self-pay | Admitting: Family Medicine

## 2016-05-10 DIAGNOSIS — E669 Obesity, unspecified: Secondary | ICD-10-CM | POA: Insufficient documentation

## 2016-05-10 NOTE — Assessment & Plan Note (Addendum)
Chronic. Patient desires to loose weight. Minimal efforts made. Would benefit from nutrition education and exercise regimen. Never smoker. A1c 5.3 at visit. --Consider referral to Dr. Gerilyn PilgrimSykes. --Help formulate plan for exercise. --RTC in 3 months for weight loss management.

## 2016-05-10 NOTE — Assessment & Plan Note (Signed)
No concerns. Overall is doing well. Last PAP on 07/2015 neg. Never had mammogram, interested. Never smoker. A1c 5.3. Obesity seems to be primary issue. Patient seeking to loose weight. Denies symptoms of fevers, chills, fatigue, CP, SOB, N//V/D, HA, abdominal pain, polyuria, polydipsia, polyphagia. --A1c UTD. Recheck in 3 months. --Received flu shot. --Discussed weight loss. Diet and exercise recommendations given. --RTC in 3 months.

## 2016-05-12 ENCOUNTER — Other Ambulatory Visit: Payer: Self-pay | Admitting: Family Medicine

## 2016-05-12 DIAGNOSIS — Z1231 Encounter for screening mammogram for malignant neoplasm of breast: Secondary | ICD-10-CM

## 2016-08-13 ENCOUNTER — Emergency Department (HOSPITAL_COMMUNITY)
Admission: EM | Admit: 2016-08-13 | Discharge: 2016-08-14 | Disposition: A | Discharge status: Home / Self Care | Payer: No Typology Code available for payment source | Attending: Emergency Medicine | Admitting: Emergency Medicine

## 2016-08-13 ENCOUNTER — Encounter (HOSPITAL_COMMUNITY): Payer: Self-pay

## 2016-08-13 DIAGNOSIS — S299XXA Unspecified injury of thorax, initial encounter: Secondary | ICD-10-CM | POA: Diagnosis not present

## 2016-08-13 DIAGNOSIS — Y999 Unspecified external cause status: Secondary | ICD-10-CM | POA: Insufficient documentation

## 2016-08-13 DIAGNOSIS — Y9389 Activity, other specified: Secondary | ICD-10-CM | POA: Diagnosis not present

## 2016-08-13 DIAGNOSIS — Y92410 Unspecified street and highway as the place of occurrence of the external cause: Secondary | ICD-10-CM | POA: Insufficient documentation

## 2016-08-13 MED ORDER — IBUPROFEN 400 MG PO TABS
600.0000 mg | ORAL_TABLET | Freq: Once | ORAL | Status: AC
  Administered 2016-08-13: 600 mg via ORAL
  Filled 2016-08-13: qty 1
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
## 2016-08-14 ENCOUNTER — Emergency Department (HOSPITAL_COMMUNITY): Payer: No Typology Code available for payment source

## 2016-08-14 MED ORDER — METHOCARBAMOL 500 MG PO TABS: 500.0000 mg | ORAL_TABLET | Freq: Two times a day (BID) | ORAL | 0 refills | Status: DC

## 2016-08-14 MED ORDER — NAPROXEN 500 MG PO TABS: 500.0000 mg | ORAL_TABLET | Freq: Two times a day (BID) | ORAL | 0 refills | Status: DC

## 2016-08-14 NOTE — Discharge Instructions (Signed)
Take your medications as prescribed. I also recommend applying ice and/or heat to affected area for 15-20 minutes 3-4 times daily for additional pain relief. Refrain from doing any heavy lifting, squatting or repetitive movements that exacerbate your symptoms. °Follow-up with your primary care provider in the next week if her symptoms have not improved.  °Please return to the Emergency Department if symptoms worsen or new onset of fever, numbness, tingling, groin anesthesia, loss of bowel or bladder, weakness, chest pain, difficulty breathing, abdominal pain, vomiting. °
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
## 2017-01-12 ENCOUNTER — Ambulatory Visit (HOSPITAL_COMMUNITY)
Admission: EM | Admit: 2017-01-12 | Discharge: 2017-01-12 | Disposition: A | Discharge status: Home / Self Care | Payer: Self-pay | Attending: Family Medicine | Admitting: Family Medicine

## 2017-01-12 ENCOUNTER — Encounter (HOSPITAL_COMMUNITY): Payer: Self-pay | Admitting: *Deleted

## 2017-01-12 DIAGNOSIS — R059 Cough, unspecified: Secondary | ICD-10-CM

## 2017-01-12 DIAGNOSIS — R05 Cough: Secondary | ICD-10-CM

## 2017-01-12 MED ORDER — AZITHROMYCIN 250 MG PO TABS: 250.0000 mg | ORAL_TABLET | Freq: Every day | ORAL | 0 refills | Status: DC

## 2017-01-12 MED ORDER — HYDROCODONE-HOMATROPINE 5-1.5 MG/5ML PO SYRP: 5.0000 mL | ORAL_SOLUTION | Freq: Four times a day (QID) | ORAL | 0 refills | Status: DC | PRN

## 2017-01-12 NOTE — ED Triage Notes (Signed)
sorethroat     Cough   With  Nasal   Congestion  X  1  Month

## 2017-01-17 NOTE — ED Provider Notes (Signed)
  [facility]   [id number] 01/12/17 Arrival Time: 1702  ASSESSMENT & PLAN:  1. Cough     Meds ordered this encounter  Medications  . HYDROcodone-homatropine (HYCODAN) 5-1.5 MG/5ML syrup    Sig: Take 5 mLs by mouth every 6 (six) hours as needed for cough.    Dispense:  90 mL    Refill:  0  . azithromycin (ZITHROMAX) 250 MG tablet    Sig: Take 1 tablet (250 mg total) by mouth daily. Take first 2 tablets together, then 1 every day until finished.    Dispense:  6 tablet    Refill:  0   F/U if not improving over the next week. OTC analgesics and symptom care as needed. Medication sedation precautions.  Reviewed expectations re: course of current medical issues. Questions answered. Outlined signs and symptoms indicating need for more acute intervention. Patient verbalized understanding. After Visit Summary given.   SUBJECTIVE:  Catherine Austin is a 46 y.o. female who presents with complaint of nasal congestion, post-nasal drainage over the past few weeks. Improving. Dry cough present that continues to linger. Worse at night. No wheezing or SOB. No n/v. Afebrile. Non-smoker. No GERD history. OTC cough medications not helping. Cough interfering with sleep.   ROS: As per HPI.   OBJECTIVE:  Vitals:   01/12/17 1721  BP: 132/78  Pulse: 80  Resp: 18  Temp: 98.6 F (37 C)  TempSrc: Oral  SpO2: 100%    General appearance: alert; no distress HEENT: nasal congestion; clear runny nose; throat irritation secondary to post-nasal drainage Neck: supple without LAD Lungs: clear to auscultation bilaterally; dry cough Skin: warm and dry Psychological: alert and cooperative; normal mood and affect  No Known Allergies  Past Medical History:  Diagnosis Date  . 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 Marital status: Single    Spouse name: N/A  . Number of children: 3  . Years of education: N/A    Occupational History  . Not on file.   Social History Main Topics  . Smoking status: Never Smoker  . Smokeless tobacco: Never Used  . Alcohol use No  . Drug use: No  . Sexual activity: Yes    Birth control/ protection: Surgical   Other Topics Concern  . 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 Early death Mother   . Diabetes Father            Mardella Layman, MD 01/17/17 [phone]

## 2017-03-27 ENCOUNTER — Ambulatory Visit: Payer: Self-pay | Admitting: Family Medicine

## 2017-05-23 ENCOUNTER — Ambulatory Visit (HOSPITAL_COMMUNITY)
Admission: EM | Admit: 2017-05-23 | Discharge: 2017-05-23 | Disposition: A | Discharge status: Home / Self Care | Payer: Medicaid Other | Attending: Emergency Medicine | Admitting: Emergency Medicine

## 2017-05-23 ENCOUNTER — Encounter (HOSPITAL_COMMUNITY): Payer: Self-pay | Admitting: Emergency Medicine

## 2017-05-23 ENCOUNTER — Other Ambulatory Visit: Payer: Self-pay

## 2017-05-23 DIAGNOSIS — J069 Acute upper respiratory infection, unspecified: Secondary | ICD-10-CM | POA: Diagnosis not present

## 2017-05-23 DIAGNOSIS — B9789 Other viral agents as the cause of diseases classified elsewhere: Secondary | ICD-10-CM

## 2017-05-23 MED ORDER — DM-GUAIFENESIN ER 30-600 MG PO TB12: 1.0000 | ORAL_TABLET | Freq: Two times a day (BID) | ORAL | 0 refills | Status: DC

## 2017-05-23 MED ORDER — CROMOLYN SODIUM 5.2 MG/ACT NA AERS: 1.0000 | INHALATION_SPRAY | Freq: Four times a day (QID) | NASAL | 12 refills | Status: DC

## 2017-05-23 NOTE — Discharge Instructions (Signed)
Push fluids to ensure adequate hydration and keep secretions thin.  Tylenol and/or ibuprofen as needed for pain or fevers.  Nasal spray up to 4 times a day to help with congestion and sore throat. Complete course of antibiotics previously prescribed. Mucinex d to help with cough and congestion.

## 2017-05-23 NOTE — ED Triage Notes (Signed)
Onset of symptoms was last night.  Patient complains of feeling bad, cough, feverish, headache and throat sore with coughing

## 2017-05-23 NOTE — ED Provider Notes (Signed)
[facility]    CSN: [id number] Arrival date & time: 05/23/17  1319     History   Chief Complaint Chief Complaint  Patient presents with  . URI    HPI MARIN WISNER is a 47 y.o. female.   Fidelia presents with family with complaints of subjective fever, cough, congestion, headache, and sore throat which started last night. She took a warm shower which helped with her symptoms. Family member present has also had similar illness. She is currently on day 5 of 10 of amoxicillin course as she had her wisdom tooth pulled recently. Has not taken any other medications for her symptoms. Without contributing medical history. Does not take daily medications.   ROS per HPI.       Past Medical History:  Diagnosis Date  . 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 Obesity (BMI 35.0-39.9 without comorbidity) 05/10/2016  . Annual physical exam 08/05/2015  . Hematuria 07/23/2015  . 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 TUBAL LIGATION  10/16/2001    OB History    No data available       Home Medications    Prior to Admission medications   Medication Sig Start Date End Date Taking? Authorizing Provider  amoxicillin (AMOXIL) 500 MG capsule Take 500 mg by mouth 3 (three) times daily.   Yes [provider]  cromolyn (NASALCROM) 5.2 MG/ACT nasal spray Place 1 spray into both nostrils 4 (four) times daily. 05/23/17   Georgetta Haber, NP  dextromethorphan-guaiFENesin (MUCINEX DM) 30-600 MG 12hr tablet Take 1 tablet by mouth 2 (two) times daily. 05/23/17   Georgetta Haber, NP    Family History Family History  Problem Relation Age of Onset  . Early death Mother   . Diabetes Father     Social History Social History   Tobacco Use  . Smoking status: Never Smoker  . Smokeless tobacco: Never Used    Substance Use Topics  . Alcohol use: No  . Drug use: No     Allergies   Patient has no known allergies.   Review of Systems Review of Systems   Physical Exam Triage Vital Signs ED Triage Vitals  Enc Vitals Group     BP 05/23/17 1523 (!) 103/52     Pulse Rate 05/23/17 1523 83     Resp 05/23/17 1523 (!) 22     Temp 05/23/17 1523 97.9 F (36.6 C)     Temp Source 05/23/17 1523 Oral     SpO2 05/23/17 1523 96 %     Weight --      Height --      Head Circumference --      Peak Flow --      Pain Score 05/23/17 1520 8     Pain Loc --      Pain Edu? --      Excl. in GC? --    No data found.  Updated Vital Signs BP (!) 103/52 (BP Location: Left Arm) Comment (BP Location): large cuff  Pulse 83   Temp 97.9 F (36.6 C) (Oral)   Resp (!) 22   LMP 04/19/2015   SpO2 96%   Visual Acuity Right Eye Distance:   Left Eye Distance:   Bilateral Distance:    Right Eye Near:  Left Eye Near:    Bilateral Near:     Physical Exam  Constitutional: She is oriented to person, place, and time. She appears well-developed and well-nourished. No distress.  HENT:  Head: Normocephalic and atraumatic.  Right Ear: Tympanic membrane, external ear and ear canal normal.  Left Ear: Tympanic membrane, external ear and ear canal normal.  Nose: Nose normal.  Mouth/Throat: Uvula is midline, oropharynx is clear and moist and mucous membranes are normal. No tonsillar exudate.  Eyes: Conjunctivae and EOM are normal. Pupils are equal, round, and reactive to light.  Cardiovascular: Normal rate, regular rhythm and normal heart sounds.  Pulmonary/Chest: Effort normal and breath sounds normal.  Occasional strong congested cough noted  Neurological: She is alert and oriented to person, place, and time.  Skin: Skin is warm and dry.     UC Treatments / Results  Labs (all labs ordered are listed, but only abnormal results are displayed) Labs Reviewed - No data to display  EKG  EKG  Interpretation None       Radiology No results found.  Procedures Procedures (including critical care time)  Medications Ordered in UC Medications - No data to display   Initial Impression / Assessment and Plan / UC Course  I have reviewed the triage vital signs and the nursing notes.  Pertinent labs & imaging results that were available during my care of the patient were reviewed by me and considered in my medical decision making (see chart for details).     Benign physical findings on exam. History and physical consistent with viral illness. Supportive cares recommended. If symptoms worsen or do not improve in the next week to return to be seen or to follow up with PCP. Patient verbalized understanding and agreeable to plan.    Final Clinical Impressions(s) / UC Diagnoses   Final diagnoses:  Viral URI with cough    ED Discharge Orders        Ordered    dextromethorphan-guaiFENesin [facility](MUCINEX DM) 30-600 MG 12hr tablet  2 times daily     05/23/17 1552    cromolyn (NASALCROM) 5.2 MG/ACT nasal spray  4 times daily     05/23/17 1552       Controlled Substance Prescriptions [place] Controlled Substance Registry consulted? Not Applicable   Georgetta HaberBurky, [place] Milnes B, NP 05/23/17 [phone]

## 2017-06-13 ENCOUNTER — Encounter: Payer: Self-pay | Admitting: Family Medicine

## 2017-06-13 ENCOUNTER — Other Ambulatory Visit: Payer: Self-pay

## 2017-06-13 ENCOUNTER — Ambulatory Visit (INDEPENDENT_AMBULATORY_CARE_PROVIDER_SITE_OTHER): Payer: Medicaid Other | Admitting: Family Medicine

## 2017-06-13 VITALS — BP 110/60 | HR 86 | Temp 98.3°F | Ht 64.0 in | Wt 240.0 lb

## 2017-06-13 DIAGNOSIS — R5383 Other fatigue: Secondary | ICD-10-CM

## 2017-06-13 DIAGNOSIS — M25562 Pain in left knee: Secondary | ICD-10-CM | POA: Insufficient documentation

## 2017-06-13 DIAGNOSIS — G8929 Other chronic pain: Secondary | ICD-10-CM | POA: Insufficient documentation

## 2017-06-13 DIAGNOSIS — Z23 Encounter for immunization: Secondary | ICD-10-CM | POA: Diagnosis not present

## 2017-06-13 DIAGNOSIS — E669 Obesity, unspecified: Secondary | ICD-10-CM
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
## 2017-06-13 NOTE — Assessment & Plan Note (Deleted)
A 
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
## 2017-06-13 NOTE — Assessment & Plan Note (Addendum)
Chronic.  No prior history of diabetes.  Does have a history of hyperlipidemia with elevated LDL.  Blood pressure seems to be well controlled today without need for antihypertensives.  Does have some fatigue. - CBC with differential, CMET, LDL, TSH - Recommending decrease carbohydrate intake

## 2017-06-13 NOTE — Patient Instructions (Addendum)
Thank you for coming in to see us today. Please see below to review our plan for today's visit.  1.  I will call you if your labs are abnormal, otherwise expect to receive the results in the mail. 2.  I have given you a sheet with information to contact the breast center to schedule for mammogram.  We do not routinely screen until age 48.  We will also arrange for you to be evaluated in the gynecology clinic for repeat Pap though we do not routinely screen until 3 years if normal. 3.  [facility] off of [address] over to get your left knee x-ray.  I will call you within the next 24 hours if the results are abnormal.  Please call the clinic at [phone] if your symptoms worsen or you have any concerns. It was our pleasure to serve you.  Durward Par[place]id Ghina Bittinger, DO [facility], PGY-2

## 2017-06-14 LAB — CBC WITH DIFFERENTIAL/PLATELET
BASOS ABS: 0 10*3/uL (ref 0.0–0.2)
Basos: 0 %
EOS (ABSOLUTE): 0.1 10*3/uL (ref 0.0–0.4)
Eos: 1 %
HEMOGLOBIN: 11.6 g/dL (ref 11.1–15.9)
Hematocrit: 37.1 % (ref 34.0–46.6)
IMMATURE GRANULOCYTES: 0 %
Immature Grans (Abs): 0 10*3/uL (ref 0.0–0.1)
Lymphocytes Absolute: 2.9 10*3/uL (ref 0.7–3.1)
Lymphs: 35 %
MCH: 24.9 pg — ABNORMAL LOW (ref 26.6–33.0)
MCHC: 31.3 g/dL — ABNORMAL LOW (ref 31.5–35.7)
MCV: 80 fL (ref 79–97)
Monocytes Absolute: 0.3 10*3/uL (ref 0.1–0.9)
Monocytes: 4 %
NEUTROS ABS: 5 10*3/uL (ref 1.4–7.0)
NEUTROS PCT: 60 %
Platelets: 359 10*3/uL (ref 150–379)
RBC: 4.66 x10E6/uL (ref 3.77–5.28)
RDW: 16.1 % — ABNORMAL HIGH (ref 12.3–15.4)
WBC: 8.3 10*3/uL (ref 3.4–10.8)

## 2017-06-14 LAB — CMP14+EGFR
ALT: 13 IU/L (ref 0–32)
AST: 11 IU/L (ref 0–40)
Albumin/Globulin Ratio: 1.3 (ref 1.2–2.2)
Albumin: 4.1 g/dL (ref 3.5–5.5)
Alkaline Phosphatase: 185 IU/L — ABNORMAL HIGH (ref 39–117)
BUN/Creatinine Ratio: 28 — ABNORMAL HIGH (ref 9–23)
BUN: 13 mg/dL (ref 6–24)
Bilirubin Total: <0.2 mg/dL (ref 0.0–1.2)
CALCIUM: 8.8 mg/dL (ref 8.7–10.2)
CHLORIDE: 102 mmol/L (ref 96–106)
CO2: 24 mmol/L (ref 20–29)
Creatinine, Ser: 0.47 mg/dL — ABNORMAL LOW (ref 0.57–1.00)
GFR calc non Af Amer: 118 mL/min/{1.73_m2} (ref >59)
GFR, EST AFRICAN AMERICAN: 136 mL/min/{1.73_m2} (ref >59)
Globulin, Total: 3.1 g/dL (ref 1.5–4.5)
Glucose: 90 mg/dL (ref 65–99)
Potassium: 4.4 mmol/L (ref 3.5–5.2)
Sodium: 140 mmol/L (ref 134–144)
TOTAL PROTEIN: 7.2 g/dL (ref 6.0–8.5)

## 2017-06-14 LAB — TSH: TSH: 0.936 u[IU]/mL (ref 0.450–4.500)

## 2017-06-14 LAB — LDL CHOLESTEROL, DIRECT: LDL DIRECT: 73 mg/dL (ref 0–99)

## 2017-06-15 ENCOUNTER — Encounter: Payer: Self-pay | Admitting: Family Medicine

## 2017-11-27 DIAGNOSIS — G44209 Tension-type headache, unspecified, not intractable: Secondary | ICD-10-CM | POA: Diagnosis not present

## 2017-11-27 DIAGNOSIS — H40033 Anatomical narrow angle, bilateral: Secondary | ICD-10-CM | POA: Diagnosis not present

## 2017-11-27 DIAGNOSIS — H5213 Myopia, bilateral: Secondary | ICD-10-CM | POA: Diagnosis not present

## 2017-12-26 ENCOUNTER — Other Ambulatory Visit: Payer: Self-pay

## 2017-12-26 ENCOUNTER — Encounter: Payer: Self-pay | Admitting: Family Medicine

## 2017-12-26 ENCOUNTER — Ambulatory Visit: Payer: Medicaid Other | Admitting: Family Medicine

## 2017-12-26 VITALS — BP 136/82 | HR 88 | Temp 98.7°F | Ht 64.0 in | Wt 239.2 lb

## 2017-12-26 DIAGNOSIS — Z23 Encounter for immunization: Secondary | ICD-10-CM | POA: Diagnosis not present

## 2017-12-26 DIAGNOSIS — Z658 Other specified problems related to psychosocial circumstances: Secondary | ICD-10-CM | POA: Diagnosis not present

## 2017-12-26 DIAGNOSIS — G44229 Chronic tension-type headache, not intractable: Secondary | ICD-10-CM | POA: Diagnosis not present
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
## 2017-12-26 NOTE — Patient Instructions (Signed)
Thank you for coming in to see [place] today. Please see below to review our plan for today's visit.  I am sorry to hear that you are having struggles with your relationship at home.  Please let me know if there is any way we can help, particularly with social work for behavioral therapy.  You can call the clinic at any time.  As for your headache, I do believe this is related to your stress and should improve if you decrease your stress level.  In the meantime, you can take up to 1000 mg of Tylenol every 6 hours as needed for headaches.  If there is any changes to your headaches such as loss of vision, weakness to a particular part of your body, change in speech, please seek immediate medical attention.  Please call the clinic at [phone] if your symptoms worsen or you have any concerns. It was our pleasure to serve you.  Durward Parcel, DO [facility], PGY-3
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
## 2017-12-30 DIAGNOSIS — H5203 Hypermetropia, bilateral: Secondary | ICD-10-CM | POA: Diagnosis not present

## 2017-12-30 DIAGNOSIS — H1013 Acute atopic conjunctivitis, bilateral: Secondary | ICD-10-CM | POA: Diagnosis not present

## 2018-08-01 ENCOUNTER — Encounter: Payer: Self-pay | Admitting: Family Medicine

## 2018-09-05 ENCOUNTER — Other Ambulatory Visit: Payer: Self-pay

## 2018-09-05 ENCOUNTER — Telehealth (INDEPENDENT_AMBULATORY_CARE_PROVIDER_SITE_OTHER): Payer: Medicaid Other | Admitting: Family Medicine

## 2018-09-05 DIAGNOSIS — R509 Fever, unspecified: Secondary | ICD-10-CM | POA: Diagnosis not present

## 2018-09-05 NOTE — Progress Notes (Signed)
[facility] Telemedicine Visit  Patient consented to have virtual visit. Method of visit: Telephone  Encounter participants: Patient: Catherine Austin - located at home Provider: Arlyce Harman - located at [facility] Others (if applicable): None  Chief Complaint: "Feeling bad and has a fever"  HPI:  Patient states at the time of this call she is feeling "completely better, walking around and feeling totally better. Patient states she began feeling bad on yesterday with a fever and a headache. She took some headache and had some Tylenol and she a good night sleep and she felt better this morning.  An interpreter was used for the entirety of this visit.  ROS: per HPI  Pertinent PMHx: Chronic headache  Exam:  Respiratory: NWOB, no distress  Assessment/Plan:  Fever and Headache Patient most likely had an acute viral illness responding to supportive therapy. Follow up as needed.  Time spent during visit with patient: >10 minutes

## 2018-09-14 ENCOUNTER — Encounter: Payer: Medicaid Other | Admitting: Family Medicine

## 2018-09-18 ENCOUNTER — Ambulatory Visit (INDEPENDENT_AMBULATORY_CARE_PROVIDER_SITE_OTHER): Payer: Medicaid Other | Admitting: Family Medicine

## 2018-09-18 ENCOUNTER — Other Ambulatory Visit (HOSPITAL_COMMUNITY)
Admission: RE | Admit: 2018-09-18 | Discharge: 2018-09-18 | Disposition: A | Discharge status: Home / Self Care | Payer: Medicaid Other | Source: Ambulatory Visit | Attending: Family Medicine | Admitting: Family Medicine

## 2018-09-18 ENCOUNTER — Encounter: Payer: Self-pay | Admitting: Family Medicine

## 2018-09-18 ENCOUNTER — Other Ambulatory Visit: Payer: Self-pay

## 2018-09-18 VITALS — BP 122/80 | HR 78 | Temp 97.7°F | Ht 64.0 in | Wt 241.6 lb

## 2018-09-18 DIAGNOSIS — Z113 Encounter for screening for infections with a predominantly sexual mode of transmission: Secondary | ICD-10-CM | POA: Insufficient documentation

## 2018-09-18 DIAGNOSIS — R748 Abnormal levels of other serum enzymes: Secondary | ICD-10-CM | POA: Diagnosis not present

## 2018-09-18 DIAGNOSIS — Z01419 Encounter for gynecological examination (general) (routine) without abnormal findings: Secondary | ICD-10-CM | POA: Diagnosis not present

## 2018-09-18 DIAGNOSIS — E66813 Obesity, class 3: Secondary | ICD-10-CM | POA: Insufficient documentation

## 2018-09-18 DIAGNOSIS — E669 Obesity, unspecified: Secondary | ICD-10-CM | POA: Diagnosis not present

## 2018-09-18 DIAGNOSIS — Z23 Encounter for immunization: Secondary | ICD-10-CM | POA: Diagnosis not present

## 2018-09-18 NOTE — Assessment & Plan Note (Signed)
No identified comorbidities.  Likely secondary to excessive caloric intake.  Has history of mildly elevated alkaline phosphatase without alcohol use. - Discuss lifestyle modifications and set smart goals - Checking CMET, GGT, and A1c - Given Tdap booster

## 2018-09-18 NOTE — Assessment & Plan Note (Addendum)
Low concern for STD.  Normal Pap in 2017. - Checking Pap - See plan for obesity

## 2018-09-18 NOTE — Patient Instructions (Signed)
Thank you for coming in to see Catherine Austin today. Please see below to review our plan for today's visit.  1.  I will call you if you have any abnormalities to your lab results within 48 hours, otherwise you should receive results in the mail. 2.  You are now up-to-date on your Pap smear.  I will call you if your results are abnormal, otherwise you should receive results in the mail. 3.  We have given you your tetanus shot, you should be good for 10 years. 4.  Your weight is a significant concern of mine and I am happy that you would like to work on losing weight.  Increase your exercise routine with vigorous walking.  If you are able to have a conversation while walking, then you should walk at a faster pace.  Try to achieve at least 150 minutes of exercise weekly.  I would like you to also cut down on snacking and limit to 1 serving 1 time weekly.  In addition, do not have second portions and limit your servings to no more than a small plate.  Please call the clinic at [phone] if your symptoms worsen or you have any concerns. It was our pleasure to serve you.  Durward Parcel, DO [facility], PGY-3

## 2018-09-18 NOTE — Progress Notes (Signed)
   Subjective   Patient ID: Catherine Austin    DOB: 1970/11/29, 48 y.o. female   MRN: [id number]  CC: "annual physical"  HPI: Catherine Austin is a 48 y.o. female who presents to clinic today for the following:  Obesity: Ms. Byland has a longstanding history of obesity with BMI in the 15s.  She does carry a diagnosis of diabetes, HLD, or HTN.  Denies family history of diabetes.  She denies increased urination, polyuria or polyphagia.  Her last A1c was WNL in 2018.  She does have a history of elevated alkaline phosphatase and denies alcohol use.  Other LFTs have remained WNL.  Patient reports eating 3 meals a day and snacks often usually with chips (likes Doritos).  She usually avoids sodas but likes to eat lots of fruit.  She usually exercises by walking approximately 35 minutes 3 times weekly.  Cervical cancer screening: Last Pap in 2017 with negative HPV and cytology.  Patient requesting repeat Pap today.  She denies any vaginal bleeding or discharge.  She has no concerns for STDs.  She denies pelvic pain.  ROS: see HPI for pertinent.  PMFSH: Reviewed. Smoking status reviewed. Medications reviewed.  Objective   BP 122/80 (BP Location: Right Arm, Patient Position: Sitting, Cuff Size: Large)   Pulse 78   Temp 97.7 F (36.5 C) (Axillary)   Ht 5\' 4"  (1.626 m)   Wt 241 lb 9.6 oz (109.6 kg)   LMP 04/19/2015   SpO2 97%   BMI 41.47 kg/m  Vitals and nursing note reviewed.  General: well nourished, well developed, NAD with non-toxic appearance HEENT: normocephalic, atraumatic, moist mucous membranes, patent ears canals bilaterally with gray TMs Neck: supple, non-tender without lymphadenopathy Cardiovascular: regular rate and rhythm without murmurs, rubs, or gallops Lungs: clear to auscultation bilaterally with normal work of breathing Abdomen: obese, soft, non-tender, non-distended, normoactive bowel sounds GU: accompanied by chaperone, no external lesion or rash, no odor, absent discharge or  bleeding in vaginal vault, cervical os nonfriable Skin: warm, dry, no rashes or lesions Extremities: warm and well perfused, normal tone, no edema  Assessment & Plan   Women's annual routine gynecological examination Low concern for STD.  Normal Pap in 2017. - Checking Pap - See plan for obesity  Obesity (BMI 35.0-39.9 without comorbidity) No identified comorbidities.  Likely secondary to excessive caloric intake.  Has history of mildly elevated alkaline phosphatase without alcohol use. - Discuss lifestyle modifications and set smart goals - Checking CMET, GGT, and A1c - Given Tdap booster  Orders Placed This Encounter  Procedures  . Comprehensive metabolic panel    Order Specific Question:   Has the patient fasted?    Answer:   No  . Gamma GT  . Hemoglobin A1c   No orders of the defined types were placed in this encounter.   Durward Parcel, DO [facility], PGY-3 09/19/2018, 10:01 AM

## 2018-09-19 LAB — COMPREHENSIVE METABOLIC PANEL
ALT: 15 IU/L (ref 0–32)
AST: 14 IU/L (ref 0–40)
Albumin/Globulin Ratio: 1.5 (ref 1.2–2.2)
Albumin: 4.2 g/dL (ref 3.8–4.8)
Alkaline Phosphatase: 199 IU/L — ABNORMAL HIGH (ref 39–117)
BUN/Creatinine Ratio: 17 (ref 9–23)
BUN: 11 mg/dL (ref 6–24)
Bilirubin Total: 0.2 mg/dL (ref 0.0–1.2)
CO2: 23 mmol/L (ref 20–29)
Calcium: 8.8 mg/dL (ref 8.7–10.2)
Chloride: 100 mmol/L (ref 96–106)
Creatinine, Ser: 0.66 mg/dL (ref 0.57–1.00)
GFR calc Af Amer: 121 mL/min/{1.73_m2} (ref >59)
GFR calc non Af Amer: 105 mL/min/{1.73_m2} (ref >59)
Globulin, Total: 2.8 g/dL (ref 1.5–4.5)
Glucose: 90 mg/dL (ref 65–99)
Potassium: 4.1 mmol/L (ref 3.5–5.2)
Sodium: 141 mmol/L (ref 134–144)
Total Protein: 7 g/dL (ref 6.0–8.5)

## 2018-09-19 LAB — GAMMA GT: GGT: 19 IU/L (ref 0–60)

## 2018-09-19 LAB — HEMOGLOBIN A1C
Est. average glucose Bld gHb Est-mCnc: 117 mg/dL
Hgb A1c MFr Bld: 5.7 % — ABNORMAL HIGH (ref 4.8–5.6)

## 2018-09-20 NOTE — Addendum Note (Signed)
Addended by: Glennie Hawk on: 09/20/2018 08:37 AM   Modules accepted: Orders

## 2018-09-21 LAB — CYTOLOGY - PAP
Adequacy: ABSENT
Diagnosis: NEGATIVE
HPV: NOT DETECTED

## 2018-09-22 ENCOUNTER — Encounter: Payer: Self-pay | Admitting: Family Medicine

## 2019-02-22 ENCOUNTER — Ambulatory Visit (HOSPITAL_COMMUNITY)
Admission: EM | Admit: 2019-02-22 | Discharge: 2019-02-22 | Disposition: A | Discharge status: Home / Self Care | Payer: Medicaid Other | Attending: Emergency Medicine | Admitting: Emergency Medicine

## 2019-02-22 ENCOUNTER — Encounter (HOSPITAL_COMMUNITY): Payer: Self-pay

## 2019-02-22 DIAGNOSIS — U071 COVID-19: Secondary | ICD-10-CM | POA: Insufficient documentation

## 2019-02-22 DIAGNOSIS — G8929 Other chronic pain: Secondary | ICD-10-CM | POA: Insufficient documentation

## 2019-02-22 DIAGNOSIS — Z833 Family history of diabetes mellitus: Secondary | ICD-10-CM | POA: Diagnosis not present

## 2019-02-22 DIAGNOSIS — B349 Viral infection, unspecified: Secondary | ICD-10-CM

## 2019-02-22 LAB — POCT URINALYSIS DIP (DEVICE)
Bilirubin Urine: NEGATIVE
Glucose, UA: NEGATIVE mg/dL
Ketones, ur: NEGATIVE mg/dL
Leukocytes,Ua: NEGATIVE
Nitrite: NEGATIVE
Protein, ur: 30 mg/dL — AB
Specific Gravity, Urine: 1.02 (ref 1.005–1.030)
Urobilinogen, UA: 0.2 mg/dL (ref 0.0–1.0)
pH: 6 (ref 5.0–8.0)

## 2019-02-22 MED ORDER — ACETAMINOPHEN 325 MG PO TABS
650.0000 mg | ORAL_TABLET | Freq: Once | ORAL | Status: AC
  Administered 2019-02-22: 650 mg via ORAL

## 2019-02-22 MED ORDER — ACETAMINOPHEN 325 MG PO TABS
ORAL_TABLET | ORAL | Status: AC
  Filled 2019-02-22: qty 2

## 2019-02-22 MED ORDER — ACETAMINOPHEN 325 MG PO TABS: 650.0000 mg | ORAL_TABLET | Freq: Four times a day (QID) | ORAL | 0 refills | Status: DC | PRN

## 2019-02-22 MED ORDER — BENZONATATE 100 MG PO CAPS: 100.0000 mg | ORAL_CAPSULE | Freq: Three times a day (TID) | ORAL | 0 refills | Status: DC | PRN
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
## 2019-02-22 NOTE — ED Triage Notes (Signed)
Pt states having chills, fever and cough x 3 days. Pt is taking Tylenol for the fever. Pt states having lower abdominal pain x 2 weeks. Pt reports she is having burning sensation when urinating.

## 2019-02-22 NOTE — Discharge Instructions (Addendum)
Your urine does not show signs of an infection.  A urine culture is pending and we will call you if it is positive requiring treatment.    Take the Tylenol as needed for fever and discomfort.  Take the Tessalon as needed for cough.    Your COVID test is pending.  You should self quarantine until your test result is back and is negative.    Go to the emergency department if you develop high fever, shortness of breath, severe diarrhea, or other concerning symptoms.

## 2019-02-24 LAB — URINE CULTURE

## 2019-02-25 LAB — NOVEL CORONAVIRUS, NAA (HOSP ORDER, SEND-OUT TO REF LAB; TAT 18-24 HRS): SARS-CoV-2, NAA: DETECTED — AB

## 2019-02-26 ENCOUNTER — Telehealth (HOSPITAL_COMMUNITY): Payer: Self-pay | Admitting: Emergency Medicine

## 2019-02-26 NOTE — Telephone Encounter (Signed)
Positive Covid detected. Attempted to reach patient to discuss results, no answer, left vm in [place] and spanish to return call.

## 2019-02-28 ENCOUNTER — Telehealth (HOSPITAL_COMMUNITY): Payer: Self-pay | Admitting: Emergency Medicine

## 2019-02-28 NOTE — Telephone Encounter (Signed)
Patient contacted with spanish interpreter and made aware of  positive covid results, quarantine times, verbalized understanding, all questions answered

## 2019-03-11 ENCOUNTER — Other Ambulatory Visit: Payer: Self-pay

## 2019-03-11 ENCOUNTER — Telehealth: Payer: Medicaid Other | Admitting: Family Medicine

## 2019-03-11 NOTE — Progress Notes (Signed)
Called patient multiple times with interpretor [phone] with no answer despite leaving voicemail. No charge for visit.

## 2019-03-12 ENCOUNTER — Telehealth (INDEPENDENT_AMBULATORY_CARE_PROVIDER_SITE_OTHER): Payer: Medicaid Other | Admitting: Family Medicine

## 2019-03-12 ENCOUNTER — Other Ambulatory Visit: Payer: Self-pay

## 2019-03-12 DIAGNOSIS — R300 Dysuria: Secondary | ICD-10-CM

## 2019-03-12 NOTE — Progress Notes (Signed)
Virtual Visit via Video Note  I connected with Catherine Austin on 03/12/19 at  2:30 PM EST by a video enabled telemedicine application and verified that I am speaking with the correct person using two identifiers. [employer] interpretor was used  Location: Patient: phone Provider: [facility]   I discussed the limitations of evaluation and management by telemedicine and the availability of in person appointments. The patient expressed understanding and agreed to proceed.  History of Present Illness: Patient complains of "pain on kidneys", pain in back.  No fevers, no change in appetite, no change in bowel movements, no nausea.  No injuries.   Patient also complains of dysuria, says it "burns" when she pees.  Denies lesions/sores/wounds/rashes.  No new discharge or vaginal bleeding. Also complains of an occasional feeling of "hotness" in her head, but has not been checking the temperature.  Says she saw a doctor friend on Sunday who checked her temperature and told her it was normal.  Observations/Objective: Speaking in full sentences, in no obvious sign of distress  Assessment and Plan: Told patient that I would prescribe antibiotics based off her description for UTI but she insisted on coming in for urine test and is scheduled for tomorrow.  Follow Up Instructions:    I discussed the assessment and treatment plan with the patient. The patient was provided an opportunity to ask questions and all were answered. The patient agreed with the plan and demonstrated an understanding of the instructions.   The patient was advised to call back or seek an in-person evaluation if the symptoms worsen or if the condition fails to improve as anticipated.  I provided 11 minutes of non-face-to-face time during this encounter.   Sherene Sires, DO

## 2019-03-13 ENCOUNTER — Other Ambulatory Visit: Payer: Self-pay

## 2019-03-13 ENCOUNTER — Telehealth: Payer: Self-pay | Admitting: Family Medicine

## 2019-03-13 ENCOUNTER — Ambulatory Visit (INDEPENDENT_AMBULATORY_CARE_PROVIDER_SITE_OTHER): Payer: Medicaid Other | Admitting: Family Medicine

## 2019-03-13 ENCOUNTER — Encounter: Payer: Self-pay | Admitting: Family Medicine

## 2019-03-13 ENCOUNTER — Ambulatory Visit: Payer: Medicaid Other

## 2019-03-13 VITALS — BP 112/68 | HR 73 | Wt 238.8 lb

## 2019-03-13 DIAGNOSIS — R300 Dysuria: Secondary | ICD-10-CM

## 2019-03-13 LAB — POCT URINALYSIS DIP (MANUAL ENTRY)
Bilirubin, UA: NEGATIVE
Blood, UA: NEGATIVE
Glucose, UA: NEGATIVE mg/dL
Ketones, POC UA: NEGATIVE mg/dL
Leukocytes, UA: NEGATIVE
Nitrite, UA: NEGATIVE
Protein Ur, POC: NEGATIVE mg/dL
Spec Grav, UA: 1.02 (ref 1.010–1.025)
Urobilinogen, UA: 0.2 E.U./dL
pH, UA: 6 (ref 5.0–8.0)
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
## 2019-03-13 NOTE — Patient Instructions (Signed)
It was a pleasure to see you today! Thank you for choosing [facility] for your primary care. Catherine Austin was seen for back pain and dysuria. Come back to the clinic 1 month to see Dr. Tammi Klippel after you see the nutritionist.  Today we talked about your complaint of dysuria and low back pain.  It does appear that your not having a urinary tract infection because the urine sample was negative.  But based off your low back pain and physical exam showing some extra weight we might try to do some lower back strengthening exercises and a consultation with a nutritionist to work on diet control.  Please remember to write down everything that you eat for 3 days before you see the nutritionist so that she has some good information to work with. If that does not work we may need to discuss seeing a Ambulance person, please get back with Dr. Tammi Klippel approximately 1 month after seeing the nutritionist   Please bring all your medications to every doctors visit   Sign up for My Chart to have easy access to your labs results, and communication with your Primary care physician.     Please check-out at the front desk before leaving the clinic.     Best,  Dr. Sherene Sires FAMILY MEDICINE RESIDENT - PGY3 03/13/2019 2:50 PM

## 2019-03-16 DIAGNOSIS — R300 Dysuria: Secondary | ICD-10-CM | POA: Insufficient documentation
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
## 2019-03-16 NOTE — Progress Notes (Signed)
    Subjective:  Catherine Austin is a 48 y.o. female who presents to the [facility] today with a chief complaint of dysuria and back pain.   HPI: Dysuria Patient with complaint of dysuria. Patient declined vaginal exam says she had no sores or discharge.  Obesity, Class III, BMI 40-49.9 (morbid obesity) ([place]) Patient had been complaining of back pain which she initially was saying was "in her kidneys "but upon exam it appears to be more lumbar muscular, no injuries   Objective:  Physical Exam: BP 112/68   Pulse 73   Wt 238 lb 12.8 oz (108.3 kg)   LMP 04/19/2015   SpO2 95%   BMI 40.99 kg/m   Gen: NAD, conversing comfortably CV: RRR with no murmurs appreciated Pulm: NWOB, CTAB with no crackles, wheezes, or rhonchi MSK: no edema, cyanosis, or clubbing noted, no spinal or hip bony abnormalities Skin: warm, dry Neuro: grossly normal, moves all extremities Psych: Normal affect and thought content  No results found for this or any previous visit (from the past 72 hour(s)).   Assessment/Plan:  Dysuria Patient with complaint of dysuria, urine sample showed no indication of infection.  Patient declined vaginal exam says she had no sores or discharge.  Encouraged to hydrate but no indication for antibiotics  Obesity, Class III, BMI 40-49.9 (morbid obesity) ([place]) Patient had been complaining of back pain which she initially was saying was "in her kidneys "but upon exam it appears to be more lumbar muscular, patient is obese and we discussed giving her some lower back PT exercises on a PDF form as well as speaking to nutrition about weight loss    Sherene Sires, [place] - PGY3 03/16/2019 8:17 PM

## 2019-03-16 NOTE — Assessment & Plan Note (Signed)
Patient with complaint of dysuria, urine sample showed no indication of infection.  Patient declined vaginal exam says she had no sores or discharge.  Encouraged to hydrate but no indication for antibiotics

## 2019-03-16 NOTE — Assessment & Plan Note (Signed)
Patient had been complaining of back pain which she initially was saying was "in her kidneys "but upon exam it appears to be more lumbar muscular, patient is obese and we discussed giving her some lower back PT exercises on a PDF form as well as speaking to nutrition about weight loss

## 2019-03-18 ENCOUNTER — Ambulatory Visit: Payer: Medicaid Other

## 2019-03-18 NOTE — Progress Notes (Deleted)
  Subjective:   Patient ID: Catherine Austin    DOB: August 13, 1970, 48 y.o. female   MRN: [id number]  MODELLE VOLLMER is a 48 y.o. female with a history of obesity, chronic tension headache here for   DYSURIA - Seen previously 11/25 for same with negative UA at that time.  Urine culture 11/6 at urgent care visit contaminated.  Pain urinating started *** days ago. Pain is: *** Medications tried: *** Any antibiotics in the last 30 days: *** More than 3 UTIs in the last 12 months: *** STD exposure: *** Possibly pregnant: ***  Symptoms Urgency: *** Frequency: *** Blood in urine: *** Pain in back:*** Fever: *** Vaginal discharge: *** Mouth Ulcers: ***    Review of Systems:  Per HPI.  Medications and smoking status reviewed.  Objective:   LMP 04/19/2015  Vitals and nursing note reviewed.  General: well nourished, well developed, in no acute distress with non-toxic appearance HEENT: normocephalic, atraumatic, moist mucous membranes Neck: supple, non-tender without lymphadenopathy CV: regular rate and rhythm without murmurs, rubs, or gallops, no lower extremity edema Lungs: clear to auscultation bilaterally with normal work of breathing Abdomen: soft, non-tender, non-distended, no masses or organomegaly palpable, normoactive bowel sounds Skin: warm, dry, no rashes or lesions Extremities: warm and well perfused, normal tone MSK: ROM grossly intact, gait normal Neuro: Alert and oriented, speech normal  Assessment & Plan:   No problem-specific Assessment & Plan notes found for this encounter.  No orders of the defined types were placed in this encounter.  No orders of the defined types were placed in this encounter.   Rory Percy, DO PGY-3, [facility] 03/18/2019 8:06 AM
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
## 2019-07-02 ENCOUNTER — Other Ambulatory Visit: Payer: Self-pay

## 2019-07-02 ENCOUNTER — Ambulatory Visit (INDEPENDENT_AMBULATORY_CARE_PROVIDER_SITE_OTHER): Payer: Medicaid Other | Admitting: Family Medicine

## 2019-07-02 ENCOUNTER — Encounter: Payer: Self-pay | Admitting: Family Medicine

## 2019-07-02 ENCOUNTER — Other Ambulatory Visit (HOSPITAL_COMMUNITY)
Admission: RE | Admit: 2019-07-02 | Discharge: 2019-07-02 | Disposition: A | Discharge status: Home / Self Care | Payer: Medicaid Other | Source: Ambulatory Visit | Attending: Family Medicine | Admitting: Family Medicine

## 2019-07-02 VITALS — BP 124/76 | HR 98 | Ht 64.0 in | Wt 250.2 lb

## 2019-07-02 DIAGNOSIS — Z0000 Encounter for general adult medical examination without abnormal findings: Secondary | ICD-10-CM

## 2019-07-02 DIAGNOSIS — Z00129 Encounter for routine child health examination without abnormal findings: Secondary | ICD-10-CM

## 2019-07-02 DIAGNOSIS — Z7251 High risk heterosexual behavior: Secondary | ICD-10-CM

## 2019-07-02 DIAGNOSIS — Z23 Encounter for immunization: Secondary | ICD-10-CM

## 2019-07-02 LAB — POCT WET PREP (WET MOUNT)
Clue Cells Wet Prep Whiff POC: NEGATIVE
Trichomonas Wet Prep HPF POC: ABSENT

## 2019-07-02 NOTE — Assessment & Plan Note (Signed)
Will obtain A1c today given history of obesity.  Flu vaccine updated today.  Information on obtaining Covid vaccine given to patient AVS per her request.

## 2019-07-02 NOTE — Assessment & Plan Note (Signed)
Discussed healthy diet and daily exercise.  Patient will work on increasing exercise to help lose weight.  Can consider bariatric surgery at some point as well.  Encouraged patient on her decision for healthy diet and daily exercise.

## 2019-07-02 NOTE — Assessment & Plan Note (Signed)
Patient with history of unprotected intercourse.  Desires STD testing today.  Gonorrhea and Chlamydia testing obtained as well as wet prep.  Will also obtain blood STD testing.

## 2019-07-02 NOTE — Patient Instructions (Signed)
Healthy Eating Following a healthy eating pattern may help you to achieve and maintain a healthy body weight, reduce the risk of chronic disease, and live a long and productive life. It is important to follow a healthy eating pattern at an appropriate calorie level for your body. Your nutritional needs should be met primarily through food by choosing a variety of nutrient-rich foods. What are tips for following this plan? Reading food labels  Read labels and choose the following: ? Reduced or low sodium. ? Juices with 100% fruit juice. ? Foods with low saturated fats and high polyunsaturated and monounsaturated fats. ? Foods with whole grains, such as whole wheat, cracked wheat, brown rice, and wild rice. ? Whole grains that are fortified with folic acid. This is recommended for women who are pregnant or who want to become pregnant.  Read labels and avoid the following: ? Foods with a lot of added sugars. These include foods that contain brown sugar, corn sweetener, corn syrup, dextrose, fructose, glucose, high-fructose corn syrup, honey, invert sugar, lactose, malt syrup, maltose, molasses, raw sugar, sucrose, trehalose, or turbinado sugar.  Do not eat more than the following amounts of added sugar per day:  6 teaspoons (25 g) for women.  9 teaspoons (38 g) for men. ? Foods that contain processed or refined starches and grains. ? Refined grain products, such as white flour, degermed cornmeal, white bread, and white rice. Shopping  Choose nutrient-rich snacks, such as vegetables, whole fruits, and nuts. Avoid high-calorie and high-sugar snacks, such as potato chips, fruit snacks, and candy.  Use oil-based dressings and spreads on foods instead of solid fats such as butter, stick margarine, or cream cheese.  Limit pre-made sauces, mixes, and "instant" products such as flavored rice, instant noodles, and ready-made pasta.  Try more plant-protein sources, such as tofu, tempeh, black beans,  edamame, lentils, nuts, and seeds.  Explore eating plans such as the Mediterranean diet or vegetarian diet. Cooking  Use oil to saut or stir-fry foods instead of solid fats such as butter, stick margarine, or lard.  Try baking, boiling, grilling, or broiling instead of frying.  Remove the fatty part of meats before cooking.  Steam vegetables in water or broth. Meal planning   At meals, imagine dividing your plate into fourths: ? One-half of your plate is fruits and vegetables. ? One-fourth of your plate is whole grains. ? One-fourth of your plate is protein, especially lean meats, poultry, eggs, tofu, beans, or nuts.  Include low-fat dairy as part of your daily diet. Lifestyle  Choose healthy options in all settings, including home, work, school, restaurants, or stores.  Prepare your food safely: ? Wash your hands after handling raw meats. ? Keep food preparation surfaces clean by regularly washing with hot, soapy water. ? Keep raw meats separate from ready-to-eat foods, such as fruits and vegetables. ? Cook seafood, meat, poultry, and eggs to the recommended internal temperature. ? Store foods at safe temperatures. In general:  Keep cold foods at 59F (4.4C) or below.  Keep hot foods at 159F (60C) or above.  Keep your freezer at [facility] (-17.8C) or below.  Foods are no longer safe to eat when they have been between the temperatures of 40-159F (4.4-60C) for more than 2 hours. What foods should I eat? Fruits Aim to eat 2 cup-equivalents of fresh, canned (in natural juice), or frozen fruits each day. Examples of 1 cup-equivalent of fruit include 1 small apple, 8 large strawberries, 1 cup canned fruit,  cup  dried fruit, or 1 cup 100% juice. Vegetables Aim to eat 2-3 cup-equivalents of fresh and frozen vegetables each day, including different varieties and colors. Examples of 1 cup-equivalent of vegetables include 2 medium carrots, 2 cups raw, leafy greens, 1 cup chopped  vegetable (raw or cooked), or 1 medium baked potato. Grains Aim to eat 6 ounce-equivalents of whole grains each day. Examples of 1 ounce-equivalent of grains include 1 slice of bread, 1 cup ready-to-eat cereal, 3 cups popcorn, or  cup cooked rice, pasta, or cereal. Meats and other proteins Aim to eat 5-6 ounce-equivalents of protein each day. Examples of 1 ounce-equivalent of protein include 1 egg, 1/2 cup nuts or seeds, or 1 tablespoon (16 g) peanut butter. A cut of meat or fish that is the size of a deck of cards is about 3-4 ounce-equivalents.  Of the protein you eat each week, try to have at least 8 ounces come from seafood. This includes salmon, trout, herring, and anchovies. Dairy Aim to eat 3 cup-equivalents of fat-free or low-fat dairy each day. Examples of 1 cup-equivalent of dairy include 1 cup (240 mL) milk, 8 ounces (250 g) yogurt, 1 ounces (44 g) natural cheese, or 1 cup (240 mL) fortified soy milk. Fats and oils  Aim for about 5 teaspoons (21 g) per day. Choose monounsaturated fats, such as canola and olive oils, avocados, peanut butter, and most nuts, or polyunsaturated fats, such as sunflower, corn, and soybean oils, walnuts, pine nuts, sesame seeds, sunflower seeds, and flaxseed. Beverages  Aim for six 8-oz glasses of water per day. Limit coffee to three to five 8-oz cups per day.  Limit caffeinated beverages that have added calories, such as soda and energy drinks.  Limit alcohol intake to no more than 1 drink a day for nonpregnant women and 2 drinks a day for men. One drink equals 12 oz of beer (355 mL), 5 oz of wine (148 mL), or 1 oz of hard liquor (44 mL). Seasoning and other foods  Avoid adding excess amounts of salt to your foods. Try flavoring foods with herbs and spices instead of salt.  Avoid adding sugar to foods.  Try using oil-based dressings, sauces, and spreads instead of solid fats. This information is based on general [place] nutrition guidelines. For more  information, visit BuildDNA.es. Exact amounts may vary based on your nutrition needs. Summary  A healthy eating plan may help you to maintain a healthy weight, reduce the risk of chronic diseases, and stay active throughout your life.  Plan your meals. Make sure you eat the right portions of a variety of nutrient-rich foods.  Try baking, boiling, grilling, or broiling instead of frying.  Choose healthy options in all settings, including home, work, school, restaurants, or stores. This information is not intended to replace advice given to you by your health care provider. Make sure you discuss any questions you have with your health care provider. Document Revised: 07/17/2017 Document Reviewed: 07/17/2017 Elsevier Patient Education  2020 [employer].    Exercising to Lose Weight Exercise is structured, repetitive physical activity to improve fitness and health. Getting regular exercise is important for everyone. It is especially important if you are overweight. Being overweight increases your risk of heart disease, stroke, diabetes, high blood pressure, and several types of cancer. Reducing your calorie intake and exercising can help you lose weight. Exercise is usually categorized as moderate or vigorous intensity. To lose weight, most people need to do a certain amount of moderate-intensity or vigorous-intensity  exercise each week. Moderate-intensity exercise  Moderate-intensity exercise is any activity that gets you moving enough to burn at least three times more energy (calories) than if you were sitting. Examples of moderate exercise include:  Walking a mile in 15 minutes.  Doing light yard work.  Biking at an easy pace. Most people should get at least 150 minutes (2 hours and 30 minutes) a week of moderate-intensity exercise to maintain their body weight. Vigorous-intensity exercise Vigorous-intensity exercise is any activity that gets you moving enough to burn at least  six times more calories than if you were sitting. When you exercise at this intensity, you should be working hard enough that you are not able to carry on a conversation. Examples of vigorous exercise include:  Running.  Playing a team sport, such as football, basketball, and soccer.  Jumping rope. Most people should get at least 75 minutes (1 hour and 15 minutes) a week of vigorous-intensity exercise to maintain their body weight. How can exercise affect me? When you exercise enough to burn more calories than you eat, you lose weight. Exercise also reduces body fat and builds muscle. The more muscle you have, the more calories you burn. Exercise also:  Improves mood.  Reduces stress and tension.  Improves your overall fitness, flexibility, and endurance.  Increases bone strength. The amount of exercise you need to lose weight depends on:  Your age.  The type of exercise.  Any health conditions you have.  Your overall physical ability. Talk to your health care provider about how much exercise you need and what types of activities are safe for you. What actions can I take to lose weight? Nutrition   Make changes to your diet as told by your health care provider or diet and nutrition specialist (dietitian). This may include: ? Eating fewer calories. ? Eating more protein. ? Eating less unhealthy fats. ? Eating a diet that includes fresh fruits and vegetables, whole grains, low-fat dairy products, and lean protein. ? Avoiding foods with added fat, salt, and sugar.  Drink plenty of water while you exercise to prevent dehydration or heat stroke. Activity  Choose an activity that you enjoy and set realistic goals. Your health care provider can help you make an exercise plan that works for you.  Exercise at a moderate or vigorous intensity most days of the week. ? The intensity of exercise may vary from person to person. You can tell how intense a workout is for you by paying  attention to your breathing and heartbeat. Most people will notice their breathing and heartbeat get faster with more intense exercise.  Do resistance training twice each week, such as: ? Push-ups. ? Sit-ups. ? Lifting weights. ? Using resistance bands.  Getting short amounts of exercise can be just as helpful as long structured periods of exercise. If you have trouble finding time to exercise, try to include exercise in your daily routine. ? Get up, stretch, and walk around every 30 minutes throughout the day. ? Go for a walk during your lunch break. ? Park your car farther away from your destination. ? If you take public transportation, get off one stop early and walk the rest of the way. ? Make phone calls while standing up and walking around. ? Take the stairs instead of elevators or escalators.  Wear comfortable clothes and shoes with good support.  Do not exercise so much that you hurt yourself, feel dizzy, or get very short of breath. Where to find more  information  U.S. Department of Health and Human Services: BondedCompany.at  Centers for Disease Control and Prevention (CDC): http://www.wolf.info/ Contact a health care provider:  Before starting a new exercise program.  If you have questions or concerns about your weight.  If you have a medical problem that keeps you from exercising. Get help right away if you have any of the following while exercising:  Injury.  Dizziness.  Difficulty breathing or shortness of breath that does not go away when you stop exercising.  Chest pain.  Rapid heartbeat. Summary  Being overweight increases your risk of heart disease, stroke, diabetes, high blood pressure, and several types of cancer.  Losing weight happens when you burn more calories than you eat.  Reducing the amount of calories you eat in addition to getting regular moderate or vigorous exercise each week helps you lose weight. This information is not intended to replace advice  given to you by your health care provider. Make sure you discuss any questions you have with your health care provider. Document Revised: 04/17/2017 Document Reviewed: 04/17/2017 Elsevier Patient Education  [place].    To obtain the COVID vaccine:  1. For patients who live in [place], [facility] Department website: [place], [place]. Vaccinations are given by appointment only. a. Patients must register on-line for an appointment to receive the vaccine. The registration link is on the website. Appointment slots are limited and fill up quickly 2. For patients who live in counties other than [place], [place] Dept of Health and [employer]. Scroll down to find a specific link to their county. [place] DHHS COVID-19: Find your spot to take your shot. 3. [place]: [facility] has begun offering vaccines to patients. Appointment is required. For now, the few slots fill quickly and they are only vaccinating patients 21 and older. The website is ShippingScam.co.uk 4. [facility]: [facility] is part of the national vaccine rollout.  5. [place] vaccination site will open in [place] at the [employer]. You can book an appointment online or by the phone a. Online: [email or web address] b. Phone: [phone]  The Center for Disease Control (CDC) is a great resource for information regarding COVID and the vaccine.

## 2019-07-03 ENCOUNTER — Encounter: Payer: Self-pay | Admitting: Family Medicine

## 2019-07-03 DIAGNOSIS — R7303 Prediabetes: Secondary | ICD-10-CM | POA: Insufficient documentation

## 2019-07-03 LAB — POCT GLYCOSYLATED HEMOGLOBIN (HGB A1C): Hemoglobin A1C: 5.7 % — AB (ref 4.0–5.6)

## 2019-07-03 LAB — HEPATITIS C ANTIBODY: Hep C Virus Ab: <0.1 s/co ratio (ref 0.0–0.9)

## 2019-07-03 LAB — HIV ANTIBODY (ROUTINE TESTING W REFLEX): HIV Screen 4th Generation wRfx: NONREACTIVE

## 2019-07-03 LAB — RPR: RPR Ser Ql: NONREACTIVE

## 2019-07-04 LAB — CERVICOVAGINAL ANCILLARY ONLY
Chlamydia: NEGATIVE
Comment: NEGATIVE
Comment: NORMAL
Neisseria Gonorrhea: NEGATIVE

## 2019-07-22 DIAGNOSIS — Z23 Encounter for immunization: Secondary | ICD-10-CM | POA: Diagnosis not present

## 2019-08-07 ENCOUNTER — Ambulatory Visit: Payer: Medicaid Other | Admitting: Family Medicine

## 2019-09-19 ENCOUNTER — Ambulatory Visit (HOSPITAL_COMMUNITY)
Admission: EM | Admit: 2019-09-19 | Discharge: 2019-09-19 | Disposition: A | Discharge status: Home / Self Care | Payer: Medicaid Other | Attending: Family Medicine | Admitting: Family Medicine

## 2019-09-19 ENCOUNTER — Other Ambulatory Visit: Payer: Self-pay

## 2019-09-19 DIAGNOSIS — R059 Cough, unspecified: Secondary | ICD-10-CM

## 2019-09-19 DIAGNOSIS — J0100 Acute maxillary sinusitis, unspecified: Secondary | ICD-10-CM | POA: Diagnosis not present

## 2019-09-19 DIAGNOSIS — R509 Fever, unspecified: Secondary | ICD-10-CM

## 2019-09-19 DIAGNOSIS — R05 Cough: Secondary | ICD-10-CM

## 2019-09-19 DIAGNOSIS — J3489 Other specified disorders of nose and nasal sinuses: Secondary | ICD-10-CM | POA: Diagnosis not present

## 2019-09-19 MED ORDER — AMOXICILLIN 875 MG PO TABS: 875.0000 mg | ORAL_TABLET | Freq: Two times a day (BID) | ORAL | 0 refills | Status: AC

## 2019-09-19 NOTE — ED Provider Notes (Signed)
[place]   [id number] 09/19/19 Arrival Time: 1937   CC: COVID symptoms  SUBJECTIVE: History from: patient.  Catherine Austin is a 49 y.o. female who presents with abrupt onset of nasal congestion, PND, fever, chills and cough for the last 3 days. Reports that she has had a J&J Covid vaccine in April. Denies sick exposure to COVID, flu or strep. Denies recent travel.  Has not tried OTC medications for this. There are no aggravating symptoms. Denies previous symptoms in the past. Denies sinus pain, rhinorrhea, sore throat, SOB, wheezing, chest pain, nausea, changes in bowel or bladder habits.    ROS: As per HPI.  All other pertinent ROS negative.     Past Medical History:  Diagnosis Date  . 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 LEEP  2012  . TUBAL LIGATION  10/16/2001   No Known Allergies No current facility-administered medications on file prior to encounter.   No current outpatient medications on file prior to encounter.   Social History   Socioeconomic History  . Marital status: Single    Spouse name: Not on file  . Number of children: 3  . Years of education: Not on file  . Highest education level: Not on file  Occupational History  . Not on file  Tobacco Use  . Smoking status: Never Smoker  . Smokeless tobacco: Never Used  Substance and Sexual Activity  . Alcohol use: No  . Drug use: No  . Sexual activity: Yes    Partners: Male    Birth control/protection: Surgical  Other Topics Concern  . 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 Difficulty of Paying Living Expenses:   Food Insecurity:   . Worried About Charity fundraiser in the Last Year:   . Arboriculturist in the Last Year:     Transportation Needs:   . Film/video editor (Medical):   Marland Kitchen Lack of Transportation (Non-Medical):   Physical Activity:   . Days of Exercise per Week:   . Minutes of Exercise per Session:   Stress:   . Feeling of Stress :   Social Connections:   . Frequency of Communication with Friends and Family:   . Frequency of Social Gatherings with Friends and Family:   . Attends Religious Services:   . Active Member of Clubs or Organizations:   . Attends Archivist Meetings:   Marland Kitchen Marital Status:   Intimate Partner Violence:   . Fear of Current or Ex-Partner:   . Emotionally Abused:   Marland Kitchen Physically Abused:   . Sexually Abused:    Family History  Problem Relation Age of Onset  . Early death Mother   . Diabetes Father     OBJECTIVE:  Vitals:   09/19/19 1308  BP: (!) 143/74  Pulse: 82  Resp: 16  Temp: 98 F (36.7 C)  SpO2: 100%     General appearance: alert; appears fatigued, but nontoxic; speaking in full sentences and tolerating own secretions HEENT: NCAT; Ears: EACs clear, TMs pearly gray; Eyes: PERRL.  EOM grossly intact. Sinuses: tender; Nose: nares patent without rhinorrhea, Throat: oropharynx clear, tonsils  non erythematous or enlarged, uvula midline  Neck: supple without LAD Lungs: unlabored respirations, symmetrical air entry; cough: mild; no respiratory distress; CTAB Heart: regular rate and rhythm.  Radial pulses 2+ symmetrical bilaterally Skin: warm and dry Psychological: alert and cooperative; normal mood and affect  LABS:  No results found for this or any previous visit (from the past 24 hour(s)).   ASSESSMENT & PLAN:  1. Acute non-recurrent maxillary sinusitis   2. Cough   3. Fever, unspecified fever cause   4. Sinus pain     Meds ordered this encounter  Medications  . amoxicillin (AMOXIL) 875 MG tablet    Sig: Take 1 tablet (875 mg total) by mouth 2 (two) times daily for 10 days.    Dispense:  20 tablet    Refill:  0    Order Specific  Question:   Supervising Provider    Answer:   Merrilee Jansky [id number]    Acute Sinusitis Cough Fever Sinus pain Prescribed amoxicillin  Take as directed and to completion COVID testing ordered.  It will take between 1-2 days for test results.  Someone will contact you regarding abnormal results.    Patient should remain in quarantine until they have received Covid results.  If negative you may resume normal activities (go back to work/school) while practicing hand hygiene, social distance, and mask wearing.  If positive, patient should remain in quarantine for 10 days from symptom onset AND greater than 72 hours after symptoms resolution (absence of fever without the use of fever-reducing medication and improvement in respiratory symptoms), whichever is longer Get plenty of rest and push fluids Use OTC zyrtec for nasal congestion, runny nose, and/or sore throat Use OTC flonase for nasal congestion and runny nose Use medications daily for symptom relief Use OTC medications like ibuprofen or tylenol as needed fever or pain Call or go to the ED if you have any new or worsening symptoms such as fever, worsening cough, shortness of breath, chest tightness, chest pain, turning blue, changes in mental status.  Reviewed expectations re: course of current medical issues. Questions answered. Outlined signs and symptoms indicating need for more acute intervention. Patient verbalized understanding. After Visit Summary given.         Moshe Cipro, NP 09/19/19 1607

## 2019-09-19 NOTE — Discharge Instructions (Addendum)
I have sent in an antibiotic for you to take twice daily for 10 days  If you are not feeling better by Sunday, come back to follow up here  If you are having trouble breathing, trouble swallowing, go to the ER

## 2019-09-19 NOTE — ED Triage Notes (Signed)
Pt c/o headache, fever at home and headache x 3 days. Pt had J&J vaccine 3 months ago

## 2019-10-09 ENCOUNTER — Other Ambulatory Visit: Payer: Self-pay

## 2019-10-09 ENCOUNTER — Ambulatory Visit (HOSPITAL_COMMUNITY)
Admission: EM | Admit: 2019-10-09 | Discharge: 2019-10-09 | Disposition: A | Discharge status: Home / Self Care | Payer: Medicaid Other | Attending: Internal Medicine | Admitting: Internal Medicine

## 2019-10-09 ENCOUNTER — Encounter (HOSPITAL_COMMUNITY): Payer: Self-pay

## 2019-10-09 DIAGNOSIS — S73101A Unspecified sprain of right hip, initial encounter: Secondary | ICD-10-CM

## 2019-10-09 MED ORDER — KETOROLAC TROMETHAMINE 30 MG/ML IJ SOLN
30.0000 mg | Freq: Once | INTRAMUSCULAR | Status: AC
  Administered 2019-10-09: 30 mg via INTRAMUSCULAR

## 2019-10-09 MED ORDER — KETOROLAC TROMETHAMINE 30 MG/ML IJ SOLN
INTRAMUSCULAR | Status: AC
  Filled 2019-10-09: qty 1

## 2019-10-09 MED ORDER — DICLOFENAC SODIUM 1 % EX GEL: 4.0000 g | Freq: Four times a day (QID) | CUTANEOUS | 0 refills | Status: DC

## 2019-10-09 MED ORDER — IBUPROFEN 400 MG PO TABS: 400.0000 mg | ORAL_TABLET | Freq: Three times a day (TID) | ORAL | 0 refills | Status: DC | PRN

## 2019-10-09 NOTE — ED Triage Notes (Addendum)
Pt c/o acute right leg pain/swelling for past three days; onset while she was at home cooking/standing. Pt points to right anterior thigh and describes pain as severe "pressure"; also c/o hot/warmth to right leg and inability to stand 2/2 pain/pressure.   Pt also reports feeling "numb, like leg is asleep".  Denies SOB, CP, fever, chills.  +2 DP pulse to right foot; +2 edema to lower leg; +1 edema left lower leg.  No pain relievers taken PTA.
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
## 2019-11-23 ENCOUNTER — Other Ambulatory Visit: Payer: Self-pay

## 2019-11-23 ENCOUNTER — Ambulatory Visit (HOSPITAL_COMMUNITY)
Admission: EM | Admit: 2019-11-23 | Discharge: 2019-11-23 | Disposition: A | Discharge status: Home / Self Care | Payer: Medicaid Other | Attending: Family Medicine | Admitting: Family Medicine

## 2019-11-23 ENCOUNTER — Encounter (HOSPITAL_COMMUNITY): Payer: Self-pay | Admitting: Emergency Medicine

## 2019-11-23 DIAGNOSIS — H00015 Hordeolum externum left lower eyelid: Secondary | ICD-10-CM | POA: Diagnosis not present

## 2019-11-23 DIAGNOSIS — M5416 Radiculopathy, lumbar region: Secondary | ICD-10-CM | POA: Diagnosis not present

## 2019-11-23 NOTE — Discharge Instructions (Signed)
Please try warm compress on the eye  Please try ibuprofen for the leg  Please follow up if your symptoms fail to improve.
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
## 2019-11-23 NOTE — ED Triage Notes (Signed)
Stye left lower eye lid for 5 days

## 2020-02-15 ENCOUNTER — Ambulatory Visit (HOSPITAL_COMMUNITY)
Admission: EM | Admit: 2020-02-15 | Discharge: 2020-02-15 | Disposition: A | Discharge status: Home / Self Care | Payer: Medicaid Other | Attending: Internal Medicine | Admitting: Internal Medicine

## 2020-02-15 ENCOUNTER — Other Ambulatory Visit: Payer: Self-pay

## 2020-02-15 ENCOUNTER — Encounter (HOSPITAL_COMMUNITY): Payer: Self-pay

## 2020-02-15 DIAGNOSIS — Z8616 Personal history of COVID-19: Secondary | ICD-10-CM | POA: Insufficient documentation

## 2020-02-15 DIAGNOSIS — R052 Subacute cough: Secondary | ICD-10-CM | POA: Insufficient documentation

## 2020-02-15 DIAGNOSIS — Z20822 Contact with and (suspected) exposure to covid-19: Secondary | ICD-10-CM | POA: Diagnosis not present

## 2020-02-15 DIAGNOSIS — R059 Cough, unspecified: Secondary | ICD-10-CM

## 2020-02-15 MED ORDER — DM-GUAIFENESIN ER 30-600 MG PO TB12: 1.0000 | ORAL_TABLET | Freq: Two times a day (BID) | ORAL | 0 refills | Status: DC | PRN
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
## 2020-02-15 NOTE — Discharge Instructions (Signed)
We will notify you by phone if your test returns positive.  You may check your MyChart for your results.  Push fluids to ensure adequate hydration and keep secretions thin.  Tylenol and/or ibuprofen as needed for pain or fevers.  Mucinex D to help with cough.  If symptoms worsen or do not improve in the next week to return to be seen or to follow up with your PCP.    Le notificaremos por telfono si su prueba da positivo. Puede consultar su MyChart para ver sus resultados. Empuje lquidos para asegurar una hidratacin adecuada y [employer] secreciones diluidas. Tylenol y / o ibuprofeno segn sea necesario para el dolor o la fiebre. Mucinex D para ayudar con la tos. Si los sntomas empeoran o no mejoran en la prxima semana, vuelva a ser visto o haga un seguimiento con su PCP.

## 2020-02-15 NOTE — ED Triage Notes (Signed)
Pt in with c/o productive cough that started over 1 week ago.   Denies any fever, n/v, diarrhea, sob, runny nose or other uri sxs

## 2020-02-16 LAB — SARS CORONAVIRUS 2 (TAT 6-24 HRS): SARS Coronavirus 2: NEGATIVE

## 2020-06-17 ENCOUNTER — Ambulatory Visit (HOSPITAL_COMMUNITY)
Admission: EM | Admit: 2020-06-17 | Discharge: 2020-06-17 | Disposition: A | Discharge status: Home / Self Care | Payer: Medicaid Other | Attending: Emergency Medicine | Admitting: Emergency Medicine

## 2020-06-17 ENCOUNTER — Other Ambulatory Visit: Payer: Self-pay

## 2020-06-17 ENCOUNTER — Encounter (HOSPITAL_COMMUNITY): Payer: Self-pay | Admitting: Emergency Medicine

## 2020-06-17 DIAGNOSIS — R1032 Left lower quadrant pain: Secondary | ICD-10-CM

## 2020-06-17 DIAGNOSIS — M25552 Pain in left hip: Secondary | ICD-10-CM | POA: Diagnosis not present

## 2020-06-17 LAB — POCT URINALYSIS DIPSTICK, ED / UC
Bilirubin Urine: NEGATIVE
Glucose, UA: NEGATIVE mg/dL
Hgb urine dipstick: NEGATIVE
Ketones, ur: NEGATIVE mg/dL
Leukocytes,Ua: NEGATIVE
Nitrite: NEGATIVE
Protein, ur: NEGATIVE mg/dL
Specific Gravity, Urine: 1.015 (ref 1.005–1.030)
Urobilinogen, UA: 0.2 mg/dL (ref 0.0–1.0)
pH: 6 (ref 5.0–8.0)

## 2020-06-17 LAB — CBG MONITORING, ED: Glucose-Capillary: 123 mg/dL — ABNORMAL HIGH (ref 70–99)

## 2020-06-17 NOTE — Discharge Instructions (Signed)
Go to the emergency department if you have acute abdominal pain or other concerning symptoms.     
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
## 2020-06-17 NOTE — ED Triage Notes (Signed)
Pt presents with left side pain xs 1 month. Denies any fall or injury.

## 2020-08-05 ENCOUNTER — Encounter: Payer: Medicaid Other | Admitting: Family Medicine

## 2020-09-03 ENCOUNTER — Encounter (HOSPITAL_COMMUNITY): Payer: Self-pay

## 2020-09-03 ENCOUNTER — Ambulatory Visit (HOSPITAL_COMMUNITY)
Admission: EM | Admit: 2020-09-03 | Discharge: 2020-09-03 | Disposition: A | Discharge status: Home / Self Care | Payer: Medicaid Other | Attending: Internal Medicine | Admitting: Internal Medicine

## 2020-09-03 ENCOUNTER — Other Ambulatory Visit: Payer: Self-pay

## 2020-09-03 DIAGNOSIS — Z20822 Contact with and (suspected) exposure to covid-19: Secondary | ICD-10-CM | POA: Insufficient documentation

## 2020-09-03 DIAGNOSIS — Z6841 Body Mass Index (BMI) 40.0 and over, adult: Secondary | ICD-10-CM | POA: Insufficient documentation

## 2020-09-03 DIAGNOSIS — B349 Viral infection, unspecified: Secondary | ICD-10-CM | POA: Diagnosis not present

## 2020-09-03 LAB — SARS CORONAVIRUS 2 (TAT 6-24 HRS): SARS Coronavirus 2: NEGATIVE

## 2020-09-03 NOTE — ED Triage Notes (Signed)
Pt reports a bump in the groin area x 1 week; feeling hot and headache x 2 days. Tylenol gives relief, last dose over 12 hrs ago.

## 2020-09-03 NOTE — Discharge Instructions (Addendum)
Increase oral fluid intake Take Tylenol/Motrin as needed for fever and/body aches. We will call you with recommendations if labs are abnormal I advised you to get a booster vaccine for COVID-19 virus if COVID-19 test results are negative
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
## 2020-09-09 ENCOUNTER — Other Ambulatory Visit: Payer: Self-pay

## 2020-09-09 ENCOUNTER — Ambulatory Visit (HOSPITAL_COMMUNITY)
Admission: EM | Admit: 2020-09-09 | Discharge: 2020-09-09 | Disposition: A | Discharge status: Home / Self Care | Payer: Medicaid Other | Attending: Physician Assistant | Admitting: Physician Assistant

## 2020-09-09 ENCOUNTER — Encounter (HOSPITAL_COMMUNITY): Payer: Self-pay | Admitting: Emergency Medicine

## 2020-09-09 DIAGNOSIS — L02211 Cutaneous abscess of abdominal wall: Secondary | ICD-10-CM

## 2020-09-09 DIAGNOSIS — S31109A Unspecified open wound of abdominal wall, unspecified quadrant without penetration into peritoneal cavity, initial encounter: Secondary | ICD-10-CM | POA: Diagnosis not present

## 2020-09-09 MED ORDER — DOXYCYCLINE HYCLATE 100 MG PO CAPS: 100.0000 mg | ORAL_CAPSULE | Freq: Two times a day (BID) | ORAL | 0 refills | Status: DC

## 2020-09-09 MED ORDER — MUPIROCIN 2 % EX OINT: 1.0000 "application " | TOPICAL_OINTMENT | Freq: Two times a day (BID) | CUTANEOUS | 0 refills | Status: DC

## 2020-09-09 NOTE — ED Triage Notes (Signed)
Pt Is present today with a small open wound on the right side of her abdomen. Pt states that she noticed it two weeks ago. Pt states that it does cause her minor pain.
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
## 2020-09-09 NOTE — Discharge Instructions (Signed)
Keep area clean.  Apply ointment twice daily with dressing changes.  Take doxycycline 100 mg twice daily.  Stay out of the sun while on this medication.  If this area increases in size or develop any additional symptoms you need to be reevaluated.

## 2020-10-30 ENCOUNTER — Other Ambulatory Visit: Payer: Self-pay

## 2020-10-30 ENCOUNTER — Encounter (HOSPITAL_COMMUNITY): Payer: Self-pay | Admitting: Emergency Medicine

## 2020-10-30 ENCOUNTER — Ambulatory Visit (HOSPITAL_COMMUNITY)
Admission: EM | Admit: 2020-10-30 | Discharge: 2020-10-30 | Disposition: A | Discharge status: Home / Self Care | Payer: Medicaid Other | Attending: Student | Admitting: Student

## 2020-10-30 DIAGNOSIS — S39012A Strain of muscle, fascia and tendon of lower back, initial encounter: Secondary | ICD-10-CM

## 2020-10-30 DIAGNOSIS — B372 Candidiasis of skin and nail: Secondary | ICD-10-CM | POA: Diagnosis not present

## 2020-10-30 MED ORDER — IBUPROFEN 800 MG PO TABS: 800.0000 mg | ORAL_TABLET | Freq: Three times a day (TID) | ORAL | 0 refills | Status: DC

## 2020-10-30 MED ORDER — TIZANIDINE HCL 2 MG PO TABS: 2.0000 mg | ORAL_TABLET | Freq: Four times a day (QID) | ORAL | 0 refills | Status: AC | PRN

## 2020-10-30 MED ORDER — NYSTATIN 100000 UNIT/GM EX CREA: TOPICAL_CREAM | CUTANEOUS | 0 refills | Status: AC

## 2020-10-30 NOTE — Discharge Instructions (Addendum)
-  Start the muscle relaxer-Zanaflex (tizanidine), up to 3 times daily for muscle spasms and pain.  This can make you drowsy, so take at bedtime or when you do not need to drive or operate machinery. -For pain- Take Tylenol 1000 mg 3 times daily, and ibuprofen 800 mg 3 times daily with food.  You can take these together, or alternate every 3-4 hours. -Nystatin cream to rash 2x daily while symptoms persist. Try to keep the area dry.

## 2020-10-30 NOTE — ED Triage Notes (Signed)
Low back pain, says she hurt it yesterday but doesn't remember what she was doing when she hurt it. Denies dysuria
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
## 2021-02-16 ENCOUNTER — Emergency Department (HOSPITAL_COMMUNITY)
Admission: EM | Admit: 2021-02-16 | Discharge: 2021-02-16 | Discharge status: Against Medical Advice | Payer: Medicaid Other

## 2021-02-16 ENCOUNTER — Ambulatory Visit (HOSPITAL_COMMUNITY)
Admission: EM | Admit: 2021-02-16 | Discharge: 2021-02-16 | Disposition: A | Discharge status: Home / Self Care | Payer: Medicaid Other | Attending: Family Medicine | Admitting: Family Medicine

## 2021-02-16 ENCOUNTER — Other Ambulatory Visit: Payer: Self-pay

## 2021-02-16 ENCOUNTER — Encounter (HOSPITAL_COMMUNITY): Payer: Self-pay

## 2021-02-16 DIAGNOSIS — M546 Pain in thoracic spine: Secondary | ICD-10-CM | POA: Diagnosis not present

## 2021-02-16 DIAGNOSIS — R079 Chest pain, unspecified: Secondary | ICD-10-CM | POA: Diagnosis not present

## 2021-02-16 DIAGNOSIS — R0789 Other chest pain: Secondary | ICD-10-CM

## 2021-02-16 MED ORDER — NAPROXEN 500 MG PO TABS: 500.0000 mg | ORAL_TABLET | Freq: Two times a day (BID) | ORAL | 0 refills | Status: DC

## 2021-02-16 MED ORDER — CYCLOBENZAPRINE HCL 10 MG PO TABS: 10.0000 mg | ORAL_TABLET | Freq: Three times a day (TID) | ORAL | 0 refills | Status: DC | PRN
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
## 2021-02-17 ENCOUNTER — Telehealth: Payer: Self-pay

## 2021-02-17 NOTE — Telephone Encounter (Signed)
Transition Care Management Follow-up Telephone Call Date of discharge and from where: 02/16/2021- How have you been since you were released from the hospital? Patient Eloped and was [facility] Any questions or concerns? No
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
## 2021-06-07 NOTE — Patient Instructions (Addendum)
Fue maravilloso verte hoy.  Por favor traiga TODOS sus medicamentos a cada visita.  Hoy hablamos de:  -Estamos haciendo una prueba de Papanicolaou hoy y verificando si hay infecciones de transmisin sexual. Te har saber los [place]. Si su prueba de Papanicolaou es normal, la prxima vencer en 3 aos. -Estamos haciendo anlisis de laboratorio hoy para revisar su colesterol. Le enviar un mensaje de MyChart si tiene MyChart. De lo contrario, lo llamar por [employer] o le enviar una carta si todo volvi a la normalidad. Si no tiene noticias mas en 2 semanas, llame a la oficina. -Est atrasada para el examen de deteccin de cncer de mama, he pedido una mamografa. Deber llamar para programar una cita lo antes posible. Su nmero es [phone]. -Estoy enviando una receta para la vacuna contra la culebrilla. Obtenga esto lo antes posible. Los efectos secundarios ms comunes de la vacuna contra la culebrilla incluyen dolor en el brazo, dolor de [place], [place] y dolores musculares despus de la vacuna, as que planifique en consecuencia. -Est atrasado para una colonoscopia para Film/video editor de colon. Estoy colocando una derivacin a IT sales professional, te llamarn para una cita.  -We are doing a Pap smear today and checking for sexually transmitted infections. I will let you know the results. If your Pap smear is normal, your next one will be due in 3 years. -We are doing lab work today to check your cholesterol. I will send you a MyChart message if you have MyChart. Otherwise, I will give you a call for abnormal results or send a letter if everything returned back normal. If you don't hear from me in 2 weeks, please call the office.   -You are overdue for breast cancer screening, I have ordered a mammogram.  You will need to call to schedule appointment at your earliest convenience. Their number is [phone].  -I am sending a prescription for the shingles vaccine. Please get this  at your earliest convenience. Most common side effects of the shingles vaccine include sore arm, headache, fever, and muscle aches following the vaccine so plan accordingly.   -You are overdue for a colonoscopy to screen for colon cancer. I am placing a referral to gastroenterology, they will call you for an appointment.  Clayburn Pert por elegir Medicina familiar [place].  Llame al [phone] si tiene alguna pregunta sobre la cita de [place].  Asegrese de programar un seguimiento en la recepcin antes de irse hoy.  Sharion Settler, D.O. PGY-2 Medicina Familiar

## 2021-06-09 ENCOUNTER — Other Ambulatory Visit: Payer: Self-pay

## 2021-06-09 ENCOUNTER — Ambulatory Visit (INDEPENDENT_AMBULATORY_CARE_PROVIDER_SITE_OTHER): Payer: Medicaid Other

## 2021-06-09 ENCOUNTER — Encounter: Payer: Self-pay | Admitting: Family Medicine

## 2021-06-09 ENCOUNTER — Other Ambulatory Visit (HOSPITAL_COMMUNITY)
Admission: RE | Admit: 2021-06-09 | Discharge: 2021-06-09 | Disposition: A | Discharge status: Home / Self Care | Payer: Medicaid Other | Source: Ambulatory Visit | Attending: Family Medicine | Admitting: Family Medicine

## 2021-06-09 ENCOUNTER — Ambulatory Visit: Payer: Medicaid Other | Admitting: Family Medicine

## 2021-06-09 VITALS — BP 124/65 | HR 77 | Ht 64.0 in | Wt 238.2 lb

## 2021-06-09 DIAGNOSIS — Z124 Encounter for screening for malignant neoplasm of cervix: Secondary | ICD-10-CM | POA: Insufficient documentation

## 2021-06-09 DIAGNOSIS — Z0000 Encounter for general adult medical examination without abnormal findings: Secondary | ICD-10-CM

## 2021-06-09 DIAGNOSIS — Z1231 Encounter for screening mammogram for malignant neoplasm of breast: Secondary | ICD-10-CM | POA: Diagnosis not present

## 2021-06-09 DIAGNOSIS — R7303 Prediabetes: Secondary | ICD-10-CM | POA: Diagnosis not present

## 2021-06-09 DIAGNOSIS — Z13220 Encounter for screening for lipoid disorders: Secondary | ICD-10-CM | POA: Diagnosis not present

## 2021-06-09 DIAGNOSIS — R87613 High grade squamous intraepithelial lesion on cytologic smear of cervix (HGSIL): Secondary | ICD-10-CM

## 2021-06-09 DIAGNOSIS — Z23 Encounter for immunization: Secondary | ICD-10-CM

## 2021-06-09 DIAGNOSIS — Z113 Encounter for screening for infections with a predominantly sexual mode of transmission: Secondary | ICD-10-CM

## 2021-06-09 DIAGNOSIS — Z1211 Encounter for screening for malignant neoplasm of colon: Secondary | ICD-10-CM

## 2021-06-09 LAB — POCT GLYCOSYLATED HEMOGLOBIN (HGB A1C): Hemoglobin A1C: 5.5 % (ref 4.0–5.6)

## 2021-06-09 LAB — POCT WET PREP (WET MOUNT)
Clue Cells Wet Prep Whiff POC: NEGATIVE
Trichomonas Wet Prep HPF POC: ABSENT

## 2021-06-09 MED ORDER — ZOSTER VAC RECOMB ADJUVANTED 50 MCG/0.5ML IM SUSR: 0.5000 mL | Freq: Once | INTRAMUSCULAR | 0 refills | Status: AC

## 2021-06-09 NOTE — Assessment & Plan Note (Addendum)
Doing well today.  Discussed lifestyle modifications with diet and exercise this patient is morbidly obese with a BMI of 40.  She does report walking for exercise. She does not drink or smoke. She feels safe. Will check lipids today. A1c normal. Due for mammogram (ordered) and colonoscopy (referral placed). Shingrix vaccine sent to preferred pharmacy.  Received second COVID vaccination today and flu vaccine.

## 2021-06-09 NOTE — Assessment & Plan Note (Signed)
History of LEEP x2, last in 2012. Requests repeat Pap today, though she is not quite due, shared decision making to repeat today.

## 2021-06-09 NOTE — Assessment & Plan Note (Signed)
Testing for GC/chlamydia, HIV, RPR, hepatitis B.  Wet prep was negative.  Patient uses barrier protection.

## 2021-06-10 LAB — HEPATITIS B SURFACE ANTIGEN: Hepatitis B Surface Ag: NEGATIVE

## 2021-06-10 LAB — LIPID PANEL
Chol/HDL Ratio: 4 ratio (ref 0.0–4.4)
Cholesterol, Total: 137 mg/dL (ref 100–199)
HDL: 34 mg/dL — ABNORMAL LOW (ref >39)
LDL Chol Calc (NIH): 82 mg/dL (ref 0–99)
Triglycerides: 114 mg/dL (ref 0–149)
VLDL Cholesterol Cal: 21 mg/dL (ref 5–40)

## 2021-06-10 LAB — RPR: RPR Ser Ql: NONREACTIVE

## 2021-06-10 LAB — HIV ANTIBODY (ROUTINE TESTING W REFLEX): HIV Screen 4th Generation wRfx: NONREACTIVE

## 2021-06-11 LAB — CYTOLOGY - PAP
Adequacy: ABSENT
Chlamydia: NEGATIVE
Comment: NEGATIVE
Comment: NEGATIVE
Comment: NEGATIVE
Comment: NORMAL
Diagnosis: NEGATIVE
High risk HPV: NEGATIVE
Neisseria Gonorrhea: NEGATIVE
Trichomonas: NEGATIVE

## 2021-06-21 ENCOUNTER — Ambulatory Visit: Payer: Medicaid Other

## 2021-06-25 ENCOUNTER — Ambulatory Visit: Payer: Medicaid Other

## 2022-06-26 DIAGNOSIS — H5213 Myopia, bilateral: Secondary | ICD-10-CM | POA: Diagnosis not present

## 2022-08-23 ENCOUNTER — Telehealth: Payer: Self-pay

## 2022-08-23 NOTE — Telephone Encounter (Signed)
LVM for patient to call back. AS, CMA 

## 2023-02-14 ENCOUNTER — Ambulatory Visit: Payer: Medicaid Other | Admitting: Student

## 2023-02-14 ENCOUNTER — Encounter: Payer: Self-pay | Admitting: Student

## 2023-02-14 ENCOUNTER — Other Ambulatory Visit: Payer: Self-pay

## 2023-02-14 VITALS — BP 120/82 | HR 72 | Wt 234.2 lb

## 2023-02-14 DIAGNOSIS — R7303 Prediabetes: Secondary | ICD-10-CM

## 2023-02-14 DIAGNOSIS — Z1211 Encounter for screening for malignant neoplasm of colon: Secondary | ICD-10-CM

## 2023-02-14 DIAGNOSIS — Z0000 Encounter for general adult medical examination without abnormal findings: Secondary | ICD-10-CM | POA: Diagnosis not present

## 2023-02-14 DIAGNOSIS — Z23 Encounter for immunization: Secondary | ICD-10-CM

## 2023-02-14 DIAGNOSIS — Z1239 Encounter for other screening for malignant neoplasm of breast: Secondary | ICD-10-CM | POA: Diagnosis not present

## 2023-02-14 DIAGNOSIS — R748 Abnormal levels of other serum enzymes: Secondary | ICD-10-CM | POA: Diagnosis not present

## 2023-02-14 LAB — POCT GLYCOSYLATED HEMOGLOBIN (HGB A1C): Hemoglobin A1C: 5.6 % (ref 4.0–5.6)

## 2023-02-14 MED ORDER — ZOSTER VAC RECOMB ADJUVANTED 50 MCG/0.5ML IM SUSR: 0.5000 mL | Freq: Once | INTRAMUSCULAR | 0 refills | Status: AC

## 2023-02-14 NOTE — Progress Notes (Signed)
    SUBJECTIVE:   Chief compliant/HPI: annual examination  Catherine Austin is a 52 y.o. who presents today for an annual exam.    History tabs reviewed and updated.   No complaints today  OBJECTIVE:   BP 120/82   Pulse 72   Wt 234 lb 3.2 oz (106.2 kg)   LMP 04/19/2015   SpO2 98%   BMI 40.20 kg/m   General: Well-appearing 52 year old female, pleasant, NAD HEENT: White sclera, clear conjunctiva, MMM Cardio: RRR, normal S1/S2 Lungs: CTAB, normal effort Abdomen: Soft, nontender palpation, nondistended Extremity: No edema BLEs Neuro: Alert, no focal deficits Psych: Mood and affect appropriate for situation  ASSESSMENT/PLAN:   Elevated alkaline phosphatase level Alk phos elevated on previous CMP's with normal GGT  -CMP today    Annual Examination  See AVS for age appropriate recommendations  PHQ score 9, #9 zero, reviewed  BP reviewed and at goal .  Advance directives discussion not discussed  Considered the following items based upon USPSTF recommendations: Diabetes screening: ordered Screening for elevated cholesterol: discussed -no need for statin indicated on lipid panel last year, can recheck in future HIV testing:  Negative last year Hepatitis C:  Negative in 2021 Hepatitis B:  Negative last year Syphilis if at high risk:  Negative last year GC/CT not at high risk and not ordered.  Cervical cancer screening: prior Pap reviewed, repeat due in 2028 Breast cancer screening:  Ordered mammogram Colorectal cancer screening: discussed, colonoscopy ordered Vaccinations COVID and flu administered today, prescription provided for Shingrix vaccine.   Follow up in 1  year or sooner if indicated.    Erick Alley, DO [facility]

## 2023-02-14 NOTE — Assessment & Plan Note (Addendum)
Alk phos elevated on previous CMP's with normal GGT  -CMP today

## 2023-02-14 NOTE — Patient Instructions (Addendum)
It was great to see you! Thank you for allowing me to participate in your care!   Our plans for today:  Call to schedule mammogram: - [facility] [address] [place],  [place]  [address]  [phone] - You will be called to schedule colonoscopy  -Go to pharmacy for shingles vaccine  -Return in 1 year or sooner if needed   We are checking some labs today, I will call you if they are abnormal will send you a MyChart message or a letter if they are normal.  If you do not hear about your labs in the next 2 weeks please let us know.  Take care and seek immediate care sooner if you develop any concerns.   Dr. Erick Alley, DO [facility]

## 2023-02-15 ENCOUNTER — Encounter: Payer: Self-pay | Admitting: Student

## 2023-02-15 LAB — COMPREHENSIVE METABOLIC PANEL
ALT: 15 [IU]/L (ref 0–32)
AST: 16 [IU]/L (ref 0–40)
Albumin: 4.4 g/dL (ref 3.8–4.9)
Alkaline Phosphatase: 187 [IU]/L — ABNORMAL HIGH (ref 44–121)
BUN/Creatinine Ratio: 15 (ref 9–23)
BUN: 9 mg/dL (ref 6–24)
Bilirubin Total: 0.2 mg/dL (ref 0.0–1.2)
CO2: 25 mmol/L (ref 20–29)
Calcium: 9.1 mg/dL (ref 8.7–10.2)
Chloride: 102 mmol/L (ref 96–106)
Creatinine, Ser: 0.6 mg/dL (ref 0.57–1.00)
Globulin, Total: 2.7 g/dL (ref 1.5–4.5)
Glucose: 107 mg/dL — ABNORMAL HIGH (ref 70–99)
Potassium: 4.4 mmol/L (ref 3.5–5.2)
Sodium: 141 mmol/L (ref 134–144)
Total Protein: 7.1 g/dL (ref 6.0–8.5)
eGFR: 107 mL/min/{1.73_m2} (ref >59)

## 2023-03-13 ENCOUNTER — Telehealth: Payer: Self-pay | Admitting: Student

## 2023-03-22 ENCOUNTER — Ambulatory Visit: Payer: Medicaid Other

## 2023-04-03 NOTE — Telephone Encounter (Signed)
error 

## 2023-09-26 ENCOUNTER — Encounter: Payer: Self-pay | Admitting: *Deleted

## 2023-10-13 ENCOUNTER — Ambulatory Visit (HOSPITAL_COMMUNITY)
Admission: EM | Admit: 2023-10-13 | Discharge: 2023-10-13 | Disposition: A | Discharge status: Home / Self Care | Attending: Nurse Practitioner | Admitting: Nurse Practitioner

## 2023-10-13 ENCOUNTER — Encounter (HOSPITAL_COMMUNITY): Payer: Self-pay

## 2023-10-13 DIAGNOSIS — S1093XA Contusion of unspecified part of neck, initial encounter: Secondary | ICD-10-CM | POA: Diagnosis not present

## 2023-10-13 DIAGNOSIS — S20211A Contusion of right front wall of thorax, initial encounter: Secondary | ICD-10-CM | POA: Diagnosis not present

## 2023-10-13 MED ORDER — NAPROXEN 500 MG PO TABS: 500.0000 mg | ORAL_TABLET | Freq: Two times a day (BID) | ORAL | 0 refills | Status: DC

## 2023-10-13 MED ORDER — KETOROLAC TROMETHAMINE 60 MG/2ML IM SOLN
60.0000 mg | Freq: Once | INTRAMUSCULAR | Status: AC
  Administered 2023-10-13: 60 mg via INTRAMUSCULAR

## 2023-10-13 MED ORDER — KETOROLAC TROMETHAMINE 60 MG/2ML IM SOLN
INTRAMUSCULAR | Status: AC
  Filled 2023-10-13: qty 2
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
## 2024-02-06 ENCOUNTER — Ambulatory Visit

## 2024-02-09 ENCOUNTER — Ambulatory Visit

## 2024-03-18 ENCOUNTER — Other Ambulatory Visit: Payer: Self-pay | Admitting: Family Medicine

## 2024-03-18 DIAGNOSIS — Z1231 Encounter for screening mammogram for malignant neoplasm of breast: Secondary | ICD-10-CM

## 2024-03-19 ENCOUNTER — Encounter: Admitting: Family Medicine

## 2024-04-12 ENCOUNTER — Ambulatory Visit
Admission: RE | Admit: 2024-04-12 | Discharge: 2024-04-12 | Disposition: A | Discharge status: Home / Self Care | Source: Ambulatory Visit | Attending: Family Medicine | Admitting: Family Medicine

## 2024-04-12 DIAGNOSIS — Z1231 Encounter for screening mammogram for malignant neoplasm of breast: Secondary | ICD-10-CM

## 2024-04-18 ENCOUNTER — Ambulatory Visit (INDEPENDENT_AMBULATORY_CARE_PROVIDER_SITE_OTHER)

## 2024-04-18 ENCOUNTER — Encounter (HOSPITAL_COMMUNITY): Payer: Self-pay

## 2024-04-18 ENCOUNTER — Ambulatory Visit (HOSPITAL_COMMUNITY)
Admission: EM | Admit: 2024-04-18 | Discharge: 2024-04-18 | Disposition: A | Discharge status: Home / Self Care | Attending: Emergency Medicine | Admitting: Emergency Medicine

## 2024-04-18 DIAGNOSIS — R1084 Generalized abdominal pain: Secondary | ICD-10-CM

## 2024-04-18 DIAGNOSIS — K5900 Constipation, unspecified: Secondary | ICD-10-CM | POA: Diagnosis not present

## 2024-04-18 DIAGNOSIS — L731 Pseudofolliculitis barbae: Secondary | ICD-10-CM | POA: Diagnosis not present

## 2024-04-18 MED ORDER — DOXYCYCLINE HYCLATE 100 MG PO TABS: 100.0000 mg | ORAL_TABLET | Freq: Two times a day (BID) | ORAL | 0 refills | Status: AC

## 2024-04-18 MED ORDER — POLYETHYLENE GLYCOL 3350 17 GM/SCOOP PO POWD: 17.0000 g | Freq: Every morning | ORAL | 2 refills | Status: AC

## 2024-04-18 NOTE — Discharge Instructions (Signed)
 La radiografa muestra que tiene una cantidad moderada de heces en la parte superior del abdomen, lo cual sugiere un estreimiento leve. Esta es la causa ms probable de su malestar abdominal en este momento.  Lea a continuacin para obtener ms informacin [employer], las dosis y print production planner que recomiendo para paramedic sus sntomas y que se sienta mejor pronto:  Adoxa, Vibramycin  (doxiciclina): Tome una (1) dosis toys 'r' us al da durante 5 [place]. Este antibitico puede aumentar su sensibilidad a la luz solar y provocarle quemaduras con mayor facilidad. Evite la exposicin directa al sol mientras lo est tomando. Evite tambin tomar ppl corporation con alimentos que contengan calcio, como productos lcteos ([place], [place], [place], [place]). El calcio se une a la doxiciclina e impide que su cuerpo la absorba. Deje pasar 2 horas entre el consumo de productos lcteos y la toma de doxiciclina.  MiraLAX (polietilenglicol): Este es un ablandador de heces muy eficaz que acta atrayendo agua a las [place], aumentando su volumen y suavizando su consistencia. MiraLAX no se absorbe en el cuerpo; permanece completamente en el tracto gastrointestinal, no afectar sus niveles de azcar en sangre ni de electrolitos y no interferir con ninguno de sus medicamentos actuales. Para prevenir el estreimiento, le recomiendo que tome una tapa (17 g) de polvo de MiraLAX mezclado con 240 ml de agua todas las [place], independientemente de si se siente estreido o no. Le recomiendo que tome MiraLAX a diario durante 3 meses para reeducar sus intestinos. Durante los primeros das de tomar MiraLAX, es posible que note ms gases de lo habitual. Esto es normal y se debe al movimiento de los gases que antes estaban atrapados por las heces retenidas.  Si los sntomas no han mejorado significativamente en los prximos 3 a 5 das, vuelva para una nueva evaluacin o consulte con su mdico habitual. Si los sntomas han beazer homes prximos 3 a 5 [place], acuda a la sala de emergencias para una evaluacin adicional.  Gracias por visitar la clnica de atencin de urgencia hoy. Agradecemos la oportunidad de participar en su atencin mdica.   Your x-ray shows that you have a moderate amount of stool in the upper part of your abdomen which is concerning for mild constipation.  This is the most likely cause of your abdominal discomfort at this time.  Please read below to learn more about the medications, dosages and frequencies that I recommend to help alleviate your symptoms and to get you feeling better soon:   Adoxa, Vibramycin  (doxycycline ): Please take one (1) dose twice daily for 5 days.This antibiotic can make you more sensitive to sunlight and may cause you to burn more easily.  Please avoid direct exposure while taking.  Please also avoid taking this medication with foods that contain calcium such as dairy products (milk, yogurt, cheese, ice cream).  Calcium binds with doxycycline  and prevents your body from absorbing it.  Please separate dairy products contain calcium and taking doxycycline  by 2 hours.   MiraLAX (polyethylene glycol): This is a very effective stool softener that works by pulling water into your stool increasing the volume of your stool and making the consistency of your stool very soft.  MiraLAX is not absorbed into the body at all, it remains entirely in your gastrointestinal track, it will not affect your blood sugar or electrolyte levels and it will not interfere with any of your current medications.  For prevention of constipation, I recommend that you take 1 capful (17 g) of the  MiraLAX powder mixed into 8 ounces of water every morning whether you are feeling constipated or not.  I recommend that you take MiraLAX daily for 3 months to retrain your bowels.  During the first few days of taking MiraLAX, you may notice that you have more gas than usual.  This is normal and is the result of movement of gas  that was previously trapped by retained stool.   If symptoms have not meaningfully improved in the next 3 to 5 days, please return for repeat evaluation or follow-up with your regular provider.  If symptoms have worsened in the next 3 to 5 days, please go to the emergency room for further evaluation.    Thank you for visiting urgent care today.  We appreciate the opportunity to participate in your care.

## 2024-04-18 NOTE — ED Provider Notes (Addendum)
 "    [facility]    CSN: [id number] Arrival date & time: 04/18/24  1459    HISTORY   Chief Complaint  Patient presents with   Abdominal Pain   lesion   HPI Catherine Austin is a pleasant, 54 y.o. female who presents to urgent care today. Patient complains of generalized upper abdominal pain for the past week.  Reports no difficulty with urination or moving her bowels.  Reports daily bowel movement.  Patient states she has not tried anything to alleviate her symptoms.  Patient also complains of a small mass on her right labia majora that is a little bit painful when she pinches it.  Patient denies vaginal discharge, vaginal itching, vaginal pain, vaginal odor, known or possible exposure to STD, burning with urination.  States she has never had a similar lesion in the past.  The history is limited by a language barrier. No language interpreter was used (Patient politely refused use of interpreter).  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   never followed up   Patient Active Problem List   Diagnosis Date Noted   Elevated alkaline phosphatase level 02/14/2023   Encounter for annual physical exam 06/09/2021   Pre-diabetes 07/03/2019   Unprotected sexual intercourse 07/02/2019   Obesity, Class III, BMI 40-49.9 (morbid obesity) (HCC) 09/18/2018   Routine screening for STI (sexually transmitted infection) 09/18/2018   Chronic tension headache 12/26/2017   Domestic problems 12/26/2017   Chronic pain of left knee 06/13/2017   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    Home Medications    Prior to Admission medications  Medication Sig Start Date End Date Taking? Authorizing Provider  naproxen  (NAPROSYN ) 500 MG tablet Take 1 tablet (500 mg  total) by mouth 2 (two) times daily with a meal. Patient not taking: Reported on 04/18/2024 10/13/23   Iola Lukes, FNP    Family History Family History  Problem Relation Age of Onset   Early death Mother    Diabetes Father    Social History Social History[1] Allergies   Patient has no known allergies.  Review of Systems Review of Systems  Gastrointestinal:  Positive for abdominal pain.   Pertinent findings revealed after performing a 14 point review of systems has been noted in the history of present illness.  Physical Exam Vital Signs BP 127/68 (BP Location: Left Arm)   Pulse 71   Temp 97.9 F (36.6 C) (Oral)   Resp 16   LMP 04/19/2015   SpO2 94%   No data found.  Physical Exam Vitals and nursing note reviewed.  Constitutional:      General: She is awake. She is not in acute distress.    Appearance: Normal appearance. She is well-developed and well-groomed. She is morbidly obese. She is not ill-appearing.  Abdominal:     General: Abdomen is protuberant. Bowel sounds are normal.     Palpations: Abdomen is soft.     Tenderness: There is generalized abdominal tenderness.  Genitourinary:  Neurological:     Mental Status: She is alert.  Psychiatric:        Behavior: Behavior is cooperative.     Visual Acuity Right Eye Distance:   Left Eye Distance:  Bilateral Distance:    Right Eye Near:   Left Eye Near:    Bilateral Near:     UC Couse / Diagnostics / Procedures:     Radiology DG Abd 1 View Result Date: 04/18/2024 EXAM: 1 VIEW XRAY OF THE ABDOMEN 04/18/2024 05:25:06 PM COMPARISON: None available. CLINICAL HISTORY: generalized abdominal pain FINDINGS: BOWEL: Nonobstructive bowel gas pattern. SOFT TISSUES: Metallic ring overlying L5 body. No abnormal calcifications. BONES: Degenerative changes in lumbar spine. IMPRESSION: 1. No acute abdominal finding. 2. Metallic ring overlying the L5 vertebral body , likely external artifact. Please correlate  clinically for foreign body. Electronically signed by: Greig Pique MD 04/18/2024 05:42 PM EST RP Workstation: [id number]    Procedures Procedures (including critical care time) EKG  Pending results:  Labs Reviewed - No data to display  Medications Ordered in UC: Medications - No data to display  UC Diagnoses / Final Clinical Impressions(s)   I have reviewed the triage vital signs and the nursing notes.  Pertinent labs & imaging results that were available during my care of the patient were reviewed by me and considered in my medical decision making (see chart for details).    Final diagnoses:  Generalized abdominal pain  Ingrown hair  Constipation, unspecified constipation type   Patient provided with education regarding constipation and ingrown hairs.  Patient advised of x-ray findings.  Patient advised to begin MiraLAX daily for relief of constipation.  Doxycycline  provided to address likely infection of ingrown hair.  Conservative care recommended.  Return precautions advised.  Please see discharge instructions below for details of plan of care as provided to patient. ED Prescriptions     Medication Sig Dispense Auth. Provider   doxycycline  (VIBRA -TABS) 100 MG tablet Take 1 tablet (100 mg total) by mouth 2 (two) times daily for 5 days. 10 tablet Joesph Shaver Scales, PA-C   polyethylene glycol powder (MIRALAX) 17 GM/SCOOP powder Take 17 g by mouth in the morning. 510 g Joesph Shaver Scales, PA-C      PDMP not reviewed this encounter.    Discharge Instructions      La radiografa muestra que tiene una cantidad moderada de heces en la parte superior del abdomen, lo cual sugiere un estreimiento leve. Esta es la causa ms probable de su malestar abdominal en este momento.  Lea a continuacin para obtener ms informacin [employer], las dosis y print production planner que recomiendo para paramedic sus sntomas y que se sienta mejor pronto:  Adoxa, Vibramycin   (doxiciclina): Tome una (1) dosis toys 'r' us al da durante 5 [place]. Este antibitico puede aumentar su sensibilidad a la luz solar y provocarle quemaduras con mayor facilidad. Evite la exposicin directa al sol mientras lo est tomando. Evite tambin tomar ppl corporation con alimentos que contengan calcio, como productos lcteos ([place], [place], [place], [place]). El calcio se une a la doxiciclina e impide que su cuerpo la absorba. Deje pasar 2 horas entre el consumo de productos lcteos y la toma de doxiciclina.  MiraLAX (polietilenglicol): Este es un ablandador de heces muy eficaz que acta atrayendo agua a las [place], aumentando su volumen y suavizando su consistencia. MiraLAX no se absorbe en el cuerpo; permanece completamente en el tracto gastrointestinal, no afectar sus niveles de azcar en sangre ni de electrolitos y no interferir con ninguno de sus medicamentos actuales. Para prevenir el estreimiento, le recomiendo que tome una tapa (17 g) de polvo de MiraLAX mezclado con 240 ml de agua todas las [place], independientemente de si se siente  estreido o no. Le recomiendo que tome MiraLAX a diario durante 3 meses para reeducar sus intestinos. Durante los primeros das de tomar MiraLAX, es posible que note ms gases de lo habitual. Esto es normal y se debe al movimiento de los gases que antes estaban atrapados por las heces retenidas.  Si los sntomas no han mejorado significativamente en los prximos 3 a 5 das, vuelva para una nueva evaluacin o consulte con su mdico habitual. Si los sntomas han [employer] prximos 3 a 5 [place], acuda a la sala de emergencias para una evaluacin adicional.  Gracias por visitar la clnica de atencin de urgencia hoy. Agradecemos la oportunidad de participar en su atencin mdica.   Your x-ray shows that you have a moderate amount of stool in the upper part of your abdomen which is concerning for mild constipation.  This is the most likely cause of your abdominal  discomfort at this time.  Please read below to learn more about the medications, dosages and frequencies that I recommend to help alleviate your symptoms and to get you feeling better soon:   Adoxa, Vibramycin  (doxycycline ): Please take one (1) dose twice daily for 5 days.This antibiotic can make you more sensitive to sunlight and may cause you to burn more easily.  Please avoid direct exposure while taking.  Please also avoid taking this medication with foods that contain calcium such as dairy products (milk, yogurt, cheese, ice cream).  Calcium binds with doxycycline  and prevents your body from absorbing it.  Please separate dairy products contain calcium and taking doxycycline  by 2 hours.   MiraLAX (polyethylene glycol): This is a very effective stool softener that works by pulling water into your stool increasing the volume of your stool and making the consistency of your stool very soft.  MiraLAX is not absorbed into the body at all, it remains entirely in your gastrointestinal track, it will not affect your blood sugar or electrolyte levels and it will not interfere with any of your current medications.  For prevention of constipation, I recommend that you take 1 capful (17 g) of the MiraLAX powder mixed into 8 ounces of water every morning whether you are feeling constipated or not.  I recommend that you take MiraLAX daily for 3 months to retrain your bowels.  During the first few days of taking MiraLAX, you may notice that you have more gas than usual.  This is normal and is the result of movement of gas that was previously trapped by retained stool.   If symptoms have not meaningfully improved in the next 3 to 5 days, please return for repeat evaluation or follow-up with your regular provider.  If symptoms have worsened in the next 3 to 5 days, please go to the emergency room for further evaluation.    Thank you for visiting urgent care today.  We appreciate the opportunity to participate in  your care.       Disposition Upon Discharge:  Condition: stable for discharge home  Patient presented with an acute illness with associated systemic symptoms and significant discomfort requiring urgent management. In my opinion, this is a condition that a prudent lay person (someone who possesses an average knowledge of health and medicine) may potentially expect to result in complications if not addressed urgently such as respiratory distress, impairment of bodily function or dysfunction of bodily organs.   Routine symptom specific, illness specific and/or disease specific instructions were discussed with the patient and/or caregiver at length.  As such, the patient has been evaluated and assessed, work-up was performed and treatment was provided in alignment with urgent care protocols and evidence based medicine.  Patient/parent/caregiver has been advised that the patient may require follow up for further testing and treatment if the symptoms continue in spite of treatment, as clinically indicated and appropriate.  Patient/parent/caregiver has been advised to return to the [facility] or PCP if no better; to PCP or the Emergency Department if new signs and symptoms develop, or if the current signs or symptoms continue to change or worsen for further workup, evaluation and treatment as clinically indicated and appropriate  The patient will follow up with their current PCP if and as advised. If the patient does not currently have a PCP we will assist them in obtaining one.   The patient may need specialty follow up if the symptoms continue, in spite of conservative treatment and management, for further workup, evaluation, consultation and treatment as clinically indicated and appropriate.  Patient/parent/caregiver verbalized understanding and agreement of plan as discussed.  All questions were addressed during visit.  Please see discharge instructions below for further details of plan.  This office  note has been dictated using Teaching laboratory technician.  Unfortunately, this method of dictation can sometimes lead to typographical or grammatical errors.  I apologize for your inconvenience in advance if this occurs.  Please do not hesitate to reach out to me if clarification is needed.      Joesph Shaver Scales, PA-C 04/18/24 1739     [1]  Social History Tobacco Use   Smoking status: Never   Smokeless tobacco: Never  Vaping Use   Vaping status: Never Used  Substance Use Topics   Alcohol use: No   Drug use: No     Joesph Shaver Scales, PA-C 04/18/24 1745  "

## 2024-04-18 NOTE — ED Triage Notes (Addendum)
 Patient did not want an interpreter when asked.  Patient states she has something growing on the right and points to her vagina. Patient added that she has also been having mid abdominal pain. x 1 week.   Patient denies using any medication for her symptoms.

## 2024-04-19 ENCOUNTER — Other Ambulatory Visit: Payer: Self-pay | Admitting: Family Medicine

## 2024-04-19 DIAGNOSIS — R928 Other abnormal and inconclusive findings on diagnostic imaging of breast: Secondary | ICD-10-CM

## 2024-04-22 ENCOUNTER — Ambulatory Visit: Payer: Self-pay | Admitting: Family Medicine

## 2024-05-01 ENCOUNTER — Ambulatory Visit
Admission: RE | Admit: 2024-05-01 | Discharge: 2024-05-01 | Disposition: A | Discharge status: Home / Self Care | Source: Ambulatory Visit | Attending: Family Medicine | Admitting: Family Medicine

## 2024-05-01 DIAGNOSIS — R928 Other abnormal and inconclusive findings on diagnostic imaging of breast: Secondary | ICD-10-CM
